# Patient Record
Sex: Male | Born: 1975 | Race: White | Hispanic: No | Marital: Single | Smoking: Heavy tobacco smoker
Health system: Southern US, Community
[De-identification: ages and names within clinical notes are randomized; demographics above are authoritative.]

## PROBLEM LIST (undated history)

## (undated) DIAGNOSIS — F419 Anxiety disorder, unspecified: Secondary | ICD-10-CM

## (undated) DIAGNOSIS — B169 Acute hepatitis B without delta-agent and without hepatic coma: Secondary | ICD-10-CM

## (undated) HISTORY — DX: Anxiety disorder, unspecified: F41.9

## (undated) HISTORY — DX: Acute hepatitis B without delta-agent and without hepatic coma: B16.9

## (undated) HISTORY — PX: ROTATOR CUFF REPAIR: SHX139

---

## 2019-06-15 ENCOUNTER — Inpatient Hospital Stay (HOSPITAL_COMMUNITY)
Admission: EM | Admit: 2019-06-15 | Discharge: 2019-06-19 | DRG: 443 | Disposition: A | Discharge status: Home / Self Care | Payer: Commercial Managed Care - PPO | Attending: Internal Medicine | Admitting: Internal Medicine

## 2019-06-15 ENCOUNTER — Emergency Department (HOSPITAL_COMMUNITY): Payer: Commercial Managed Care - PPO

## 2019-06-15 ENCOUNTER — Encounter (HOSPITAL_COMMUNITY): Payer: Self-pay | Admitting: Emergency Medicine

## 2019-06-15 ENCOUNTER — Other Ambulatory Visit: Payer: Self-pay

## 2019-06-15 DIAGNOSIS — F17210 Nicotine dependence, cigarettes, uncomplicated: Secondary | ICD-10-CM | POA: Diagnosis present

## 2019-06-15 DIAGNOSIS — R17 Unspecified jaundice: Secondary | ICD-10-CM | POA: Diagnosis present

## 2019-06-15 DIAGNOSIS — Z20828 Contact with and (suspected) exposure to other viral communicable diseases: Secondary | ICD-10-CM | POA: Diagnosis present

## 2019-06-15 DIAGNOSIS — B169 Acute hepatitis B without delta-agent and without hepatic coma: Secondary | ICD-10-CM | POA: Diagnosis not present

## 2019-06-15 DIAGNOSIS — Z79890 Hormone replacement therapy (postmenopausal): Secondary | ICD-10-CM

## 2019-06-15 DIAGNOSIS — Z79899 Other long term (current) drug therapy: Secondary | ICD-10-CM

## 2019-06-15 DIAGNOSIS — K5900 Constipation, unspecified: Secondary | ICD-10-CM | POA: Diagnosis present

## 2019-06-15 DIAGNOSIS — R7401 Elevation of levels of liver transaminase levels: Secondary | ICD-10-CM | POA: Diagnosis present

## 2019-06-15 LAB — COMPREHENSIVE METABOLIC PANEL
ALT: 2257 U/L — ABNORMAL HIGH (ref 0–44)
AST: 1289 U/L — ABNORMAL HIGH (ref 15–41)
Albumin: 3.7 g/dL (ref 3.5–5.0)
Alkaline Phosphatase: 186 U/L — ABNORMAL HIGH (ref 38–126)
Anion gap: 11 (ref 5–15)
BUN: 11 mg/dL (ref 6–20)
CO2: 25 mmol/L (ref 22–32)
Calcium: 9.1 mg/dL (ref 8.9–10.3)
Chloride: 99 mmol/L (ref 98–111)
Creatinine, Ser: 0.7 mg/dL (ref 0.61–1.24)
GFR calc Af Amer: >60 mL/min (ref >60)
GFR calc non Af Amer: >60 mL/min (ref >60)
Glucose, Bld: 116 mg/dL — ABNORMAL HIGH (ref 70–99)
Potassium: 3.5 mmol/L (ref 3.5–5.1)
Sodium: 135 mmol/L (ref 135–145)
Total Bilirubin: 10.9 mg/dL — ABNORMAL HIGH (ref 0.3–1.2)
Total Protein: 6.8 g/dL (ref 6.5–8.1)

## 2019-06-15 LAB — URINALYSIS, ROUTINE W REFLEX MICROSCOPIC
Bilirubin Urine: NEGATIVE
Glucose, UA: NEGATIVE mg/dL
Hgb urine dipstick: NEGATIVE
Ketones, ur: NEGATIVE mg/dL
Leukocytes,Ua: NEGATIVE
Nitrite: NEGATIVE
Protein, ur: NEGATIVE mg/dL
Specific Gravity, Urine: 1.008 (ref 1.005–1.030)
pH: 6 (ref 5.0–8.0)

## 2019-06-15 LAB — CBC
HCT: 39.8 % (ref 39.0–52.0)
Hemoglobin: 14.4 g/dL (ref 13.0–17.0)
MCH: 31.8 pg (ref 26.0–34.0)
MCHC: 36.2 g/dL — ABNORMAL HIGH (ref 30.0–36.0)
MCV: 87.9 fL (ref 80.0–100.0)
Platelets: 284 10*3/uL (ref 150–400)
RBC: 4.53 MIL/uL (ref 4.22–5.81)
RDW: 16.7 % — ABNORMAL HIGH (ref 11.5–15.5)
WBC: 8.1 10*3/uL (ref 4.0–10.5)
nRBC: 0 % (ref 0.0–0.2)

## 2019-06-15 LAB — PROTIME-INR
INR: 1 (ref 0.8–1.2)
Prothrombin Time: 13.1 seconds (ref 11.4–15.2)

## 2019-06-15 LAB — LIPASE, BLOOD: Lipase: 41 U/L (ref 11–51)

## 2019-06-15 LAB — ETHANOL: Alcohol, Ethyl (B): <10 mg/dL (ref <10)

## 2019-06-15 LAB — ACETAMINOPHEN LEVEL: Acetaminophen (Tylenol), Serum: <10 ug/mL — ABNORMAL LOW (ref 10–30)

## 2019-06-15 MED ORDER — IOHEXOL 300 MG/ML  SOLN
100.0000 mL | Freq: Once | INTRAMUSCULAR | Status: AC | PRN
  Administered 2019-06-15: 100 mL via INTRAVENOUS

## 2019-06-15 MED ORDER — SODIUM CHLORIDE (PF) 0.9 % IJ SOLN
INTRAMUSCULAR | Status: AC
  Filled 2019-06-15: qty 50

## 2019-06-15 NOTE — ED Triage Notes (Signed)
Pt reports having increasing hematuria and also family noticed yellowing of pt eyes. Pt family concerned for possible liver or kidney issues. Pt reports lower left back pain.

## 2019-06-15 NOTE — ED Provider Notes (Signed)
[facility] DEPT Provider Note   CSN: [id number] Arrival date & time: 06/15/19  1952     History   Chief Complaint Chief Complaint  Patient presents with  . Jaundice  . Hematuria    HPI Antonio Chapman is a 43 y.o. male.     HPI Patient presents with yellowing skin.  Has had for around the last week and a half.  States he first noticed that his skin was changing little yellow than other people began to notice 2.  Has some mild left low back pain.  No weight loss.  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  States he will take 2 to 3 g of Tylenol a day has been taking it somewhat over the last week also.  No fevers.  Does not drink alcohol.  Has tattoos but states he all came from reputable areas.  Urine is been a little bit darker.  Patient does not have a PCP. History reviewed. No pertinent past medical history.  There are no active problems to display for this patient.   Past Surgical History:  Procedure Laterality Date  . ROTATOR CUFF REPAIR Left         Home Medications    Prior to Admission medications   Not on File    Family History History reviewed. No pertinent family history.  Social History Social History   Tobacco Use  . Smoking status: Heavy Tobacco Smoker    Packs/day: 1.00    Types: Cigarettes  . Smokeless tobacco: Never Used  Substance Use Topics  . Alcohol use: Never    Frequency: Never  . Drug use: Yes    Types: Marijuana     Allergies   Patient has no known allergies.   Review of Systems Review of Systems  Constitutional: Negative for appetite change, diaphoresis, fatigue and fever.  HENT: Negative for congestion.   Respiratory: Negative for shortness of breath.   Cardiovascular: Negative for chest pain.  Gastrointestinal: Positive for constipation. Negative for abdominal pain and blood in stool.  Genitourinary: Negative for difficulty urinating.  Musculoskeletal:  Positive for arthralgias.  Neurological: Negative for weakness.  Psychiatric/Behavioral: Negative for confusion.     Physical Exam Updated Vital Signs BP 138/87 (BP Location: Left Arm)   Pulse 92   Temp 98.5 F (36.9 C) (Oral)   Resp 17   Ht 5' 7"  (1.702 m)   Wt 74.8 kg   SpO2 98%   BMI 25.84 kg/m   Physical Exam Vitals signs and nursing note reviewed.  HENT:     Head: Normocephalic.     Mouth/Throat:     Mouth: Mucous membranes are moist.  Eyes:     General: Scleral icterus present.  Cardiovascular:     Rate and Rhythm: Normal rate.  Pulmonary:     Effort: Pulmonary effort is normal.     Breath sounds: No wheezing or rhonchi.  Abdominal:     Tenderness: There is no abdominal tenderness.  Musculoskeletal:     Right lower leg: No edema.     Left lower leg: No edema.  Skin:    Capillary Refill: Capillary refill takes less than 2 seconds.     Coloration: Skin is jaundiced.  Neurological:     Mental Status: He is alert and oriented to person, place, and time.      ED Treatments / Results  Labs (all labs ordered are listed, but only abnormal results are  displayed) Labs Reviewed  COMPREHENSIVE METABOLIC PANEL - Abnormal; Notable for the following components:      Result Value   Glucose, Bld 116 (*)    AST 1,289 (*)    ALT 2,257 (*)    Alkaline Phosphatase 186 (*)    Total Bilirubin 10.9 (*)    All other components within normal limits  CBC - Abnormal; Notable for the following components:   MCHC 36.2 (*)    RDW 16.7 (*)    All other components within normal limits  URINALYSIS, ROUTINE W REFLEX MICROSCOPIC - Abnormal; Notable for the following components:   Color, Urine AMBER (*)    All other components within normal limits  LIPASE, BLOOD  ACETAMINOPHEN LEVEL  PROTIME-INR  ETHANOL  HEPATITIS PANEL, ACUTE    EKG None  Radiology No results found.  Procedures Procedures (including critical care time)  Medications Ordered in ED Medications - No  data to display   Initial Impression / Assessment and Plan / ED Course  I have reviewed the triage vital signs and the nursing notes.  Pertinent labs & imaging results that were available during my care of the patient were reviewed by me and considered in my medical decision making (see chart for details).        Patient presents with painless jaundice.  Does not drink alcohol.  Has been taking Tylenol but at what sounds like therapeutic doses.  AST over 1000 and ALT over 2000.  Alk phos only mildly elevated with bilirubin of 10.  Normal lipase.  Good renal function.  Normal albumin.  Does not have outpatient providers.  With the enzymes is elevated CR feels the patient benefit from [facility] the hospital.  Will get hepatitis panel add Tylenol level and alcohol level.  Will get ultrasound to start imaging.  Final Clinical Impressions(s) / ED Diagnoses   Final diagnoses:  Jaundice  Transaminitis    ED Discharge Orders    None       Davonna Belling, MD 06/15/19 2151

## 2019-06-16 ENCOUNTER — Inpatient Hospital Stay (HOSPITAL_COMMUNITY): Payer: Commercial Managed Care - PPO

## 2019-06-16 DIAGNOSIS — S36119A Unspecified injury of liver, initial encounter: Secondary | ICD-10-CM

## 2019-06-16 DIAGNOSIS — R740 Nonspecific elevation of levels of transaminase and lactic acid dehydrogenase [LDH]: Secondary | ICD-10-CM

## 2019-06-16 DIAGNOSIS — R109 Unspecified abdominal pain: Secondary | ICD-10-CM | POA: Diagnosis not present

## 2019-06-16 DIAGNOSIS — Z20828 Contact with and (suspected) exposure to other viral communicable diseases: Secondary | ICD-10-CM | POA: Diagnosis present

## 2019-06-16 DIAGNOSIS — R1013 Epigastric pain: Secondary | ICD-10-CM

## 2019-06-16 DIAGNOSIS — Z79899 Other long term (current) drug therapy: Secondary | ICD-10-CM | POA: Diagnosis not present

## 2019-06-16 DIAGNOSIS — F17210 Nicotine dependence, cigarettes, uncomplicated: Secondary | ICD-10-CM | POA: Diagnosis present

## 2019-06-16 DIAGNOSIS — B169 Acute hepatitis B without delta-agent and without hepatic coma: Secondary | ICD-10-CM | POA: Diagnosis present

## 2019-06-16 DIAGNOSIS — R12 Heartburn: Secondary | ICD-10-CM | POA: Diagnosis not present

## 2019-06-16 DIAGNOSIS — K5900 Constipation, unspecified: Secondary | ICD-10-CM | POA: Diagnosis present

## 2019-06-16 DIAGNOSIS — S36119D Unspecified injury of liver, subsequent encounter: Secondary | ICD-10-CM | POA: Diagnosis not present

## 2019-06-16 DIAGNOSIS — Z79890 Hormone replacement therapy (postmenopausal): Secondary | ICD-10-CM | POA: Diagnosis not present

## 2019-06-16 DIAGNOSIS — R17 Unspecified jaundice: Secondary | ICD-10-CM | POA: Diagnosis present

## 2019-06-16 LAB — PROTIME-INR
INR: 1.1 (ref 0.8–1.2)
Prothrombin Time: 13.6 seconds (ref 11.4–15.2)

## 2019-06-16 LAB — COMPREHENSIVE METABOLIC PANEL
ALT: 2107 U/L — ABNORMAL HIGH (ref 0–44)
AST: 1280 U/L — ABNORMAL HIGH (ref 15–41)
Albumin: 3.6 g/dL (ref 3.5–5.0)
Alkaline Phosphatase: 163 U/L — ABNORMAL HIGH (ref 38–126)
Anion gap: 7 (ref 5–15)
BUN: 10 mg/dL (ref 6–20)
CO2: 26 mmol/L (ref 22–32)
Calcium: 9.2 mg/dL (ref 8.9–10.3)
Chloride: 103 mmol/L (ref 98–111)
Creatinine, Ser: 0.68 mg/dL (ref 0.61–1.24)
GFR calc Af Amer: >60 mL/min (ref >60)
GFR calc non Af Amer: >60 mL/min (ref >60)
Glucose, Bld: 94 mg/dL (ref 70–99)
Potassium: 4.2 mmol/L (ref 3.5–5.1)
Sodium: 136 mmol/L (ref 135–145)
Total Bilirubin: 12.7 mg/dL — ABNORMAL HIGH (ref 0.3–1.2)
Total Protein: 7 g/dL (ref 6.5–8.1)

## 2019-06-16 LAB — IRON AND TIBC
Iron: 283 ug/dL — ABNORMAL HIGH (ref 45–182)
Saturation Ratios: 81 % — ABNORMAL HIGH (ref 17.9–39.5)
TIBC: 350 ug/dL (ref 250–450)
UIBC: 67 ug/dL

## 2019-06-16 LAB — CK: Total CK: 61 U/L (ref 49–397)

## 2019-06-16 LAB — SARS CORONAVIRUS 2 BY RT PCR (HOSPITAL ORDER, PERFORMED IN ~~LOC~~ HOSPITAL LAB): SARS Coronavirus 2: NEGATIVE

## 2019-06-16 LAB — FERRITIN: Ferritin: 4521 ng/mL — ABNORMAL HIGH (ref 24–336)

## 2019-06-16 LAB — TSH: TSH: 0.768 u[IU]/mL (ref 0.350–4.500)

## 2019-06-16 LAB — HIV ANTIBODY (ROUTINE TESTING W REFLEX): HIV Screen 4th Generation wRfx: NONREACTIVE

## 2019-06-16 LAB — GAMMA GT: GGT: 212 U/L — ABNORMAL HIGH (ref 7–50)

## 2019-06-16 MED ORDER — SODIUM CHLORIDE 0.9 % IV SOLN: 250.0000 mL | INTRAVENOUS | Status: DC | PRN

## 2019-06-16 MED ORDER — MELATONIN 5 MG PO TABS
10.0000 mg | ORAL_TABLET | Freq: Every evening | ORAL | Status: DC | PRN
  Filled 2019-06-16: qty 2

## 2019-06-16 MED ORDER — ENOXAPARIN SODIUM 40 MG/0.4ML ~~LOC~~ SOLN
40.0000 mg | SUBCUTANEOUS | Status: DC
  Administered 2019-06-16 – 2019-06-19 (×4): 40 mg via SUBCUTANEOUS
  Filled 2019-06-16 (×4): qty 0.4

## 2019-06-16 MED ORDER — TECHNETIUM TC 99M MEBROFENIN IV KIT
10.7000 | PACK | Freq: Once | INTRAVENOUS | Status: AC | PRN
  Administered 2019-06-16: 10.7 via INTRAVENOUS

## 2019-06-16 MED ORDER — ACETAMINOPHEN 500 MG PO TABS: 1000.0000 mg | ORAL_TABLET | Freq: Four times a day (QID) | ORAL | Status: DC | PRN

## 2019-06-16 MED ORDER — SODIUM CHLORIDE 0.9% FLUSH: 3.0000 mL | INTRAVENOUS | Status: DC | PRN

## 2019-06-16 MED ORDER — PANTOPRAZOLE SODIUM 40 MG PO TBEC
40.0000 mg | DELAYED_RELEASE_TABLET | Freq: Every day | ORAL | Status: DC
  Administered 2019-06-17 – 2019-06-19 (×3): 40 mg via ORAL
  Filled 2019-06-16 (×3): qty 1

## 2019-06-16 MED ORDER — SODIUM CHLORIDE 0.45 % IV SOLN
INTRAVENOUS | Status: DC
  Administered 2019-06-16 – 2019-06-18 (×4): via INTRAVENOUS

## 2019-06-16 NOTE — Consult Note (Deleted)
[facility] Medical Consultation  Gust Broomshomas Simkin NWG:[id number] DOB: 02/18/1976 DOA: 06/15/2019 PCP: Etta GrandchildJones, Glendel L, MD   Requesting physician: Dr. Rubin PayorPickering Date of consultation: 06/15/2019 Reason for consultation: Painless jaundice  Impression/Recommendations Active Problems:   Painless jaundice    1. Painless Jaundice - patent with painless jaundice. U/S abd with thickened GB wall but compressed. No visible stone. CT abd negative except for Gb findings as above. Lab remarkable for elevated LFTs. Hepatitis panel pending. Patient feels well.  No indication for admission given the patient's stability. Suspect Hep A although Hep C cannot be ruled out. He is a good candidate for outpatient follow up, including possible further diagnostic testing if Hepatitis screen is negative.  Patient is instructed to contact his PCP follow-up and for referral to GI for further evaluation. He should not return to work until cleared by his PCP or GI physician.   Chief Complaint: Painless jaundice  HPI:  Mr. Novella RobYackle is a healthy 43 year old gentleman who presents for painless jaundice.  He noticed this incidentally.  He has had no symptoms.  Denies fever, sweats, chills, denies nausea or vomiting, he denies weight loss.  He does not use IV drugs.  Has no exposure risks.  He has not Been around any sick persons. He has no prior hx of liver disease of any kind.  Review of Systems:  Cardio - negative Pul - negative: no cough, SOB GI - no pain, no N/V/D GU- negative MSK - negative  History reviewed. No pertinent past medical history. Past Surgical History:  Procedure Laterality Date  . ROTATOR CUFF REPAIR Left    Social History: [employer] veteran.  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 Has a stable relationship.   reports that he has been smoking cigarettes. He has been smoking about 1.00 pack per day. He has never used smokeless tobacco. He reports current drug use. Drug: Marijuana. He reports that he does not  drink alcohol.  No Known Allergies History reviewed. No pertinent family history.  Prior to Admission medications   Medication Sig Start Date End Date Taking? Authorizing Provider  acetaminophen (TYLENOL) 500 MG tablet Take 1,000 mg by mouth every 6 (six) hours as needed for mild pain, moderate pain or headache.   Yes [provider]  Melatonin 10 MG CAPS Take 10 mg by mouth at bedtime as needed (sleep).   Yes [provider]  naproxen sodium (ALEVE) 220 MG tablet Take 440 mg by mouth 2 (two) times daily as needed (pain).   Yes [provider]  omeprazole (PRILOSEC) 20 MG capsule Take 20 mg by mouth daily.   Yes [provider]   Physical Exam: Blood pressure 110/74, pulse 86, temperature 98.5 F (36.9 C), temperature source Oral, resp. rate 18, height 5\' 7"  (1.702 m), weight 74.8 kg, SpO2 98 %. Vitals:   06/15/19 2230 06/15/19 2245  BP: 110/74   Pulse: 85 86  Resp: (!) 24 18  Temp:    SpO2: 98% 98%     General:  WNWD man in no distress  Eyes: Mild icterus, PERRLA  ENT: Oropharynx nl  Neck: supple, no thyromegally  Cardiovascular: RRR  Respiratory: normal respirations  Abdomen: no tenderness, Guarding, rebound, nl BS  Skin: mild jaundice  Musculoskeletal: normal  Psychiatric: normal mood  Neurologic: non-focal  Labs on Admission:  Basic Metabolic Panel: Recent Labs  Lab 06/15/19 2040  NA 135  K 3.5  CL 99  CO2 25  GLUCOSE 116*  BUN 11  CREATININE 0.70  CALCIUM 9.1   Liver Function Tests: Recent Labs  Lab 06/15/19 2040  AST 1,289*  ALT 2,257*  ALKPHOS 186*  BILITOT 10.9*  PROT 6.8  ALBUMIN 3.7   Recent Labs  Lab 06/15/19 2040  LIPASE 41   No results for input(s): AMMONIA in the last 168 hours. CBC: Recent Labs  Lab 06/15/19 2040  WBC 8.1  HGB 14.4  HCT 39.8  MCV 87.9  PLT 284   Cardiac Enzymes: No results for input(s): CKTOTAL, CKMB, CKMBINDEX, TROPONINI in the last 168 hours. BNP: Invalid  input(s): POCBNP CBG: No results for input(s): GLUCAP in the last 168 hours.  Radiological Exams on Admission: US Abdomen Complete  Result Date: 06/15/2019 CLINICAL DATA:  Jaundice EXAM: ABDOMEN ULTRASOUND COMPLETE COMPARISON:  None. FINDINGS: Gallbladder: The gallbladder is largely decompressed at the time of sonographic imaging which results in suboptimal evaluation. There is borderline gallbladder wall thickening measuring up to 5 mm in maximal thickness. There is a trace amount of pericholecystic fluid in the gallbladder fossa as well. Sonographic Murphy sign however is reportedly negative. No visible calcified gallstones are evident. Common bile duct: Diameter: 3.5 mm, normal Liver: No focal lesion identified. Within normal limits in parenchymal echogenicity. Portal vein is patent on color Doppler imaging with normal direction of blood flow towards the liver. IVC: No abnormality visualized. Pancreas: Visualized portion unremarkable. Spleen: Size and appearance within normal limits. Right Kidney: Length: 11.9 cm. Echogenicity within normal limits. No mass or hydronephrosis visualized. Left Kidney: Length: 11.4 cm. Echogenicity within normal limits. No mass or hydronephrosis visualized. Abdominal aorta: No aneurysm visualized. Normal caliber at 2.4 cm in the mid segment. Other findings: None. IMPRESSION: Gallbladder is largely decompressed at the time of exam with mild wall thickening which may be related to this underdistention. A trace amount of pericholecystic fluid is nonspecific particularly given the negative sonographic Murphy sign. If there is persisting clinical concern for cholecystitis, could repeat limited right upper quadrant ultrasound in the fasting state or pursue a HIDA scan when available. Otherwise unremarkable abdominal ultrasound. Electronically Signed   By: Kreg Shropshire M.D.   On: 06/15/2019 22:22   Ct Abdomen Pelvis W Contrast  Result Date: 06/16/2019 CLINICAL DATA:  Painless  jaundice, fatigue and weight loss EXAM: CT ABDOMEN AND PELVIS WITH CONTRAST TECHNIQUE: Multidetector CT imaging of the abdomen and pelvis was performed using the standard protocol following bolus administration of intravenous contrast. CONTRAST:  OMNIPAQUE IOHEXOL 300 MG/ML  SOLN COMPARISON:  Abdominal ultrasound June 15, 2019 FINDINGS: Lower chest: Lung bases are clear. Normal heart size. No pericardial effusion. Hepatobiliary: No suspicious hepatic lesions. The gallbladder is largely decompressed at the time exam however gallbladder wall thickening measures up to 6 mm in maximal thickness. There is adjacent pericholecystic inflammation and fluid. Small amount of fluid tracks along the inferior liver as well. Pancreas: Unremarkable. No pancreatic ductal dilatation or surrounding inflammatory changes. Spleen: Normal in size without focal abnormality. Adrenals/Urinary Tract: Adrenal glands are unremarkable. Kidneys are normal, without renal calculi, focal lesion, or hydronephrosis. Mild bladder wall thickening with faint perivesicular haze. Stomach/Bowel: Distal esophagus, stomach and duodenal sweep are unremarkable. No bowel wall thickening or dilatation. No evidence of obstruction. A normal appendix is visualized. No colonic dilatation or wall thickening. Vascular/Lymphatic: Atherosclerotic plaque within the normal caliber aorta. No aneurysm or ectasia. No suspicious or enlarged lymph nodes in the included lymphatic chains. Reproductive: The prostate and seminal vesicles are unremarkable. Other: Small amount of pericholecystic and right upper quadrant fluid.  No free intraperitoneal air. No bowel containing hernias. Musculoskeletal: No acute osseous abnormality or suspicious osseous lesion. Mild discogenic and facet degenerative changes are present in the included portions of the thoracic lumbar spine. IMPRESSION: 1. The gallbladder is largely decompressed at the time exam however gallbladder wall  thickening measures up to 6 mm in maximal thickness. There is adjacent pericholecystic and right upper quadrant fluid. Findings are concerning for acute cholecystitis in the appropriate clinical setting 2. Mild bladder wall thickening with faint perivesicular haze, suggestive of cystitis. Correlate with urinary symptoms and consider urinalysis. 3. Aortic Atherosclerosis (ICD10-I70.0). Electronically Signed   By: Lovena Le M.D.   On: 06/16/2019 00:07    EKG: Independently reviewed. none  Time spent: 30 min  Adella Hare [facility] Pager [phone]  If 7PM-7AM, please contact night-coverage [email or web address] Password [email or web address] 06/16/2019, 1:20 AM

## 2019-06-16 NOTE — Progress Notes (Signed)
PROGRESS NOTE    Antonio Chapman  CVE:[id number] DOB: [birth year] DOA: 06/15/2019 PCP: Janith Lima, MD    Brief Narrative: 43 y.o. male with medical history significant of rotator cuff tear but otherwise healthy. He noticed that he looked a little yellow and came to [facility] ED for evaluation.He has been totally asymptomatic: no fever, sweats, chills, weight loss, N/V. He denies IVDU, denies EtOH use.    ED Course: Hemodynamically stable. Labs revealed marked elevation LFTs ALT>AST, Abd U/S with thickened GB wall but no stones, CT abd - negative except for thick wall GB, pericholic fluid, Hep panel pending. ED-MD called Dr. Ardis Hughs on call for [facility] who recommended admission to trend LFTs and to establish GI care.  Assessment & Plan:   Active Problems:   Painless jaundice  #1 painless jaundice-with elevated bilirubin 10.9 and transaminitis.  HIDA scan pending.  Seen by GI.  Follow-up hepatitis panel.  CT of the abdomen shows probable gallbladder wall thickening and some pericholecystic fluid with normal CBD and normal-appearing liver.  DVT prophylaxis with Lovenox CODE STATUS full code Family communication none Disposition pending clinical improvement Consults GI   Estimated body mass index is 26.76 kg/m as calculated from the following:   Height as of this encounter: 5\' 7"  (1.702 m).   Weight as of this encounter: 77.5 kg.    Subjective: No complaints no nausea vomiting abdominal pain  Objective: Vitals:   06/16/19 0306 06/16/19 0351 06/16/19 0651 06/16/19 1316  BP: 117/76 118/86 110/73 116/85  Pulse: 84 83 74 79  Resp: 18 20 16 16   Temp:  97.8 F (36.6 C) 97.9 F (36.6 C) 98.7 F (37.1 C)  TempSrc:  Oral Oral Oral  SpO2: 98% 98% 97% 98%  Weight:  77.5 kg    Height:  5\' 7"  (1.702 m)      Intake/Output Summary (Last 24 hours) at 06/16/2019 1412 Last data filed at 06/16/2019 0830 Gross per 24 hour  Intake 240 ml  Output -  Net 240 ml   Filed Weights   06/15/19  2028 06/16/19 0351  Weight: 74.8 kg 77.5 kg    Examination:  General exam: Appears calm and comfortable  Respiratory system: Clear to auscultation. Respiratory effort normal. Cardiovascular system: S1 & S2 heard, RRR. No JVD, murmurs, rubs, gallops or clicks. No pedal edema. Gastrointestinal system: Abdomen is nondistended, soft and nontender. No organomegaly or masses felt. Normal bowel sounds heard. Central nervous system: Alert and oriented. No focal neurological deficits. Extremities: Symmetric 5 x 5 power. Skin: No rashes, lesions or ulcers Psychiatry: Judgement and insight appear normal. Mood & affect appropriate.     Data Reviewed: I have personally reviewed following labs and imaging studies  CBC: Recent Labs  Lab 06/15/19 2040  WBC 8.1  HGB 14.4  HCT 39.8  MCV 87.9  PLT 025   Basic Metabolic Panel: Recent Labs  Lab 06/15/19 2040 06/16/19 0647  NA 135 136  K 3.5 4.2  CL 99 103  CO2 25 26  GLUCOSE 116* 94  BUN 11 10  CREATININE 0.70 0.68  CALCIUM 9.1 9.2   GFR: Estimated Creatinine Clearance: 110.2 mL/min (by C-G formula based on SCr of 0.68 mg/dL). Liver Function Tests: Recent Labs  Lab 06/15/19 2040 06/16/19 0647  AST 1,289* 1,280*  ALT 2,257* 2,107*  ALKPHOS 186* 163*  BILITOT 10.9* 12.7*  PROT 6.8 7.0  ALBUMIN 3.7 3.6   Recent Labs  Lab 06/15/19 2040  LIPASE 41   No results  for input(s): AMMONIA in the last 168 hours. Coagulation Profile: Recent Labs  Lab 06/15/19 2142  INR 1.0   Cardiac Enzymes: Recent Labs  Lab 06/16/19 0814  CKTOTAL 61   BNP (last 3 results) No results for input(s): PROBNP in the last 8760 hours. HbA1C: No results for input(s): HGBA1C in the last 72 hours. CBG: No results for input(s): GLUCAP in the last 168 hours. Lipid Profile: No results for input(s): CHOL, HDL, LDLCALC, TRIG, CHOLHDL, LDLDIRECT in the last 72 hours. Thyroid Function Tests: Recent Labs    06/16/19 1307  TSH 0.768   Anemia Panel:  Recent Labs    06/16/19 1110  FERRITIN 4,521*  TIBC 350  IRON 283*   Sepsis Labs: No results for input(s): PROCALCITON, LATICACIDVEN in the last 168 hours.  Recent Results (from the past 240 hour(s))  SARS Coronavirus 2 [facility](Hospital order, Performed [facility]) Nasopharyngeal Nasopharyngeal Swab     Status: None   Collection Time: 06/15/19 11:14 PM   Specimen: Nasopharyngeal Swab  Result Value Ref Range Status   SARS Coronavirus 2 NEGATIVE NEGATIVE Final    Comment: (NOTE) If result is NEGATIVE SARS-CoV-2 target nucleic acids are NOT DETECTED. The SARS-CoV-2 RNA is generally detectable in upper and lower  respiratory specimens during the acute phase of infection. The lowest  concentration of SARS-CoV-2 viral copies this assay can detect is 250  copies / mL. A negative result does not preclude SARS-CoV-2 infection  and should not be used as the sole basis for treatment or other  patient management decisions.  A negative result may occur with  improper specimen collection / handling, submission of specimen other  than nasopharyngeal swab, presence of viral mutation(s) within the  areas targeted by this assay, and inadequate number of viral copies  (<250 copies / mL). A negative result must be combined with clinical  observations, patient history, and epidemiological information. If result is POSITIVE SARS-CoV-2 target nucleic acids are DETECTED. The SARS-CoV-2 RNA is generally detectable in upper and lower  respiratory specimens dur ing the acute phase of infection.  Positive  results are indicative of active infection with SARS-CoV-2.  Clinical  correlation with patient history and other diagnostic information is  necessary to determine patient infection status.  Positive results do  not rule out bacterial infection or co-infection with other viruses. If result is PRESUMPTIVE POSTIVE SARS-CoV-2 nucleic acids MAY BE PRESENT.   A presumptive positive result was  obtained on the submitted specimen  and confirmed on repeat testing.  While 2019 novel coronavirus  (SARS-CoV-2) nucleic acids may be present in the submitted sample  additional confirmatory testing may be necessary for epidemiological  and / or clinical management purposes  to differentiate between  SARS-CoV-2 and other Sarbecovirus currently known to infect humans.  If clinically indicated additional testing with an alternate test  methodology [phone] is advised. The SARS-CoV-2 RNA is generally  detectable in upper and lower respiratory sp ecimens during the acute  phase of infection. The expected result is Negative. Fact Sheet for Patients:  BoilerBrush.com.cyhttps://www.fda.gov/media/136312/download Fact Sheet for Healthcare Providers: https://pope.com/https://www.fda.gov/media/136313/download This test is not yet approved or cleared by the [place] FDA and has been authorized for detection and/or diagnosis of SARS-CoV-2 by FDA under an Emergency Use Authorization (EUA).  This EUA will remain in effect (meaning this test can be used) for the duration of the COVID-19 declaration under Section 564(b)(1) of the Act, 21 U.S.C. section 360bbb-3(b)(1), unless the authorization is terminated or  revoked sooner. Performed at [facility], [address] [place], [place] [address]          Radiology Studies: Kore[place] Abdomen Complete  Result Date: 06/15/2019 CLINICAL DATA:  Jaundice EXAM: ABDOMEN ULTRASOUND COMPLETE COMPARISON:  None. FINDINGS: Gallbladder: The gallbladder is largely decompressed at the time of sonographic imaging which results in suboptimal evaluation. There is borderline gallbladder wall thickening measuring up to 5 mm in maximal thickness. There is a trace amount of pericholecystic fluid in the gallbladder fossa as well. Sonographic Murphy sign however is reportedly negative. No visible calcified gallstones are evident. Common bile duct: Diameter: 3.5 mm, normal Liver: No focal  lesion identified. Within normal limits in parenchymal echogenicity. Portal vein is patent on color Doppler imaging with normal direction of blood flow towards the liver. IVC: No abnormality visualized. Pancreas: Visualized portion unremarkable. Spleen: Size and appearance within normal limits. Right Kidney: Length: 11.9 cm. Echogenicity within normal limits. No mass or hydronephrosis visualized. Left Kidney: Length: 11.4 cm. Echogenicity within normal limits. No mass or hydronephrosis visualized. Abdominal aorta: No aneurysm visualized. Normal caliber at 2.4 cm in the mid segment. Other findings: None. IMPRESSION: Gallbladder is largely decompressed at the time of exam with mild wall thickening which may be related to this underdistention. A trace amount of pericholecystic fluid is nonspecific particularly given the negative sonographic Murphy sign. If there is persisting clinical concern for cholecystitis, could repeat limited right upper quadrant ultrasound in the fasting state or pursue a HIDA scan when available. Otherwise unremarkable abdominal ultrasound. Electronically Signed   By: Kreg ShropshirePrice  DeHay M.D.   On: 06/15/2019 22:22   Ct Abdomen Pelvis W Contrast  Result Date: 06/16/2019 CLINICAL DATA:  Painless jaundice, fatigue and weight loss EXAM: CT ABDOMEN AND PELVIS WITH CONTRAST TECHNIQUE: Multidetector CT imaging of the abdomen and pelvis was performed using the standard protocol following bolus administration of intravenous contrast. CONTRAST:  100mL OMNIPAQUE IOHEXOL 300 MG/ML  SOLN COMPARISON:  Abdominal ultrasound June 15, 2019 FINDINGS: Lower chest: Lung bases are clear. Normal heart size. No pericardial effusion. Hepatobiliary: No suspicious hepatic lesions. The gallbladder is largely decompressed at the time exam however gallbladder wall thickening measures up to 6 mm in maximal thickness. There is adjacent pericholecystic inflammation and fluid. Small amount of fluid tracks along the inferior  liver as well. Pancreas: Unremarkable. No pancreatic ductal dilatation or surrounding inflammatory changes. Spleen: Normal in size without focal abnormality. Adrenals/Urinary Tract: Adrenal glands are unremarkable. Kidneys are normal, without renal calculi, focal lesion, or hydronephrosis. Mild bladder wall thickening with faint perivesicular haze. Stomach/Bowel: Distal esophagus, stomach and duodenal sweep are unremarkable. No bowel wall thickening or dilatation. No evidence of obstruction. A normal appendix is visualized. No colonic dilatation or wall thickening. Vascular/Lymphatic: Atherosclerotic plaque within the normal caliber aorta. No aneurysm or ectasia. No suspicious or enlarged lymph nodes in the included lymphatic chains. Reproductive: The prostate and seminal vesicles are unremarkable. Other: Small amount of pericholecystic and right upper quadrant fluid. No free intraperitoneal air. No bowel containing hernias. Musculoskeletal: No acute osseous abnormality or suspicious osseous lesion. Mild discogenic and facet degenerative changes are present in the included portions of the thoracic lumbar spine. IMPRESSION: 1. The gallbladder is largely decompressed at the time exam however gallbladder wall thickening measures up to 6 mm in maximal thickness. There is adjacent pericholecystic and right upper quadrant fluid. Findings are concerning for acute cholecystitis in the appropriate clinical setting 2. Mild bladder wall thickening with faint perivesicular haze, suggestive  of cystitis. Correlate with urinary symptoms and consider urinalysis. 3. Aortic Atherosclerosis (ICD10-I70.0). Electronically Signed   By: Kreg Shropshire M.D.   On: 06/16/2019 00:07        Scheduled Meds: . enoxaparin (LOVENOX) injection  40 mg Subcutaneous Q24H  . pantoprazole  40 mg Oral Daily   Continuous Infusions: . sodium chloride 125 mL/hr at 06/16/19 1258  . sodium chloride       LOS: 0 days     Alwyn Ren,  MD [facility]  If 7PM-7AM, please contact night-coverage [email or web address] Password [email or web address] 06/16/2019, 2:12 PM

## 2019-06-16 NOTE — Consult Note (Signed)
Consultation  Referring Provider: [facility] Rodena Piety MD Primary Care Physician:  Janith Lima, MD Primary Gastroenterologist:  None/unassigned  Reason for Consultation: Jaundice  HPI: Antonio Chapman is a 43 y.o. male, generally in good health with no known chronic medical problems who was admitted through the emergency room yesterday after presenting with jaundice. Patient has no prior history of abnormal liver tests, hepatitis or liver problems that he is aware of. He says he developed what he thought was severe indigestion 11 or 12 days ago with epigastric discomfort which was fairly constant throughout the day.  This was associated with decrease in appetite but no nausea or vomiting.  No fever or chills.  He started taking Prilosec which he thought was helping.  Around that same time he noticed darkening of his urine but thought he may be dehydrated so started pushing water. He denies any recent fatigue.  Weight has been stable.  He does take Tylenol and Aleve but not in excess of doses.  He had not been taking any other over-the-counter medications, no other dietary supplements.  He smokes marijuana but denies any other current drug use.  He does have remote history of drug use last about 25 years ago. Patient has tattoos, the last about 2 years ago, he does admit to unprotected sex  in the past couple of months. Of note he has had a lot of of what he thinks are arthritic type symptoms primarily over the past year with knee and ankle pain and some hip pain.  Over the past couple of months has had onset of upper extremity joint discomfort primarily in his shoulders and wrists.  No history of EtOH use, family history negative for liver disease as far as he is aware, negative for autoimmune disease as far as he is aware.  Work-up in the emergency room with upper abdominal ultrasound showed a thickened gallbladder wall at 5 mm with trace pericholecystic fluid, CBD of 3.5 mm and normal-appearing  liver. CT of the abdomen and pelvis yesterday showed the gallbladder to be decompressed but gallbladder wall about 6 mm and there was adjacent pericholecystic fluid raising question of acute cholecystitis.  Normal-appearing liver.  Also had mild bladder wall thickening with faint perivesicular haze suggestive of cystitis.  Labs revealed T bili of 12.7/alk phos 163/AST 1280/ALT 2107. Creatinine 0.68 CBC within normal limits INR 1.0 Acute hepatitis panel pending, EtOH level negative, COVID-19 negative HIV pending   History reviewed. No pertinent past medical history.  Past Surgical History:  Procedure Laterality Date  . ROTATOR CUFF REPAIR Left     Prior to Admission medications   Medication Sig Start Date End Date Taking? Authorizing Provider  acetaminophen (TYLENOL) 500 MG tablet Take 1,000 mg by mouth every 6 (six) hours as needed for mild pain, moderate pain or headache.   Yes [provider]  Melatonin 10 MG CAPS Take 10 mg by mouth at bedtime as needed (sleep).   Yes [provider]  naproxen sodium (ALEVE) 220 MG tablet Take 440 mg by mouth 2 (two) times daily as needed (pain).   Yes [provider]  omeprazole (PRILOSEC) 20 MG capsule Take 20 mg by mouth daily.   Yes [provider]    Current Facility-Administered Medications  Medication Dose Route Frequency Provider Last Rate Last Dose  . 0.9 %  sodium chloride infusion  250 mL Intravenous PRN Norins, Heinz Knuckles, MD      . acetaminophen (TYLENOL) tablet 1,000 mg  1,000 mg Oral Q6H PRN Norins, Heinz Knuckles, MD      . enoxaparin (LOVENOX) injection 40 mg  40 mg Subcutaneous Q24H Norins, Heinz Knuckles, MD      . Melatonin TABS 10 mg  10 mg Oral QHS PRN Norins, Heinz Knuckles, MD      . pantoprazole (PROTONIX) EC tablet 40 mg  40 mg Oral Daily Norins, Michael E, MD      . sodium chloride flush (NS) 0.9 % injection 3 mL  3 mL Intravenous PRN Norins, Heinz Knuckles, MD        Allergies as of 06/15/2019  . (No  Known Allergies)    History reviewed. No pertinent family history.  Social History   Socioeconomic History  . Marital status: Single    Spouse name: Not on file  . Number of children: Not on file  . Years of education: Not on file  . Highest education level: Not on file  Occupational History  . Not on file  Social Needs  . Financial resource strain: Not on file  . Food insecurity    Worry: Not on file    Inability: Not on file  . Transportation needs    Medical: Not on file    Non-medical: Not on file  Tobacco Use  . Smoking status: Heavy Tobacco Smoker    Packs/day: 1.00    Types: Cigarettes  . Smokeless tobacco: Never Used  Substance and Sexual Activity  . Alcohol use: Never    Frequency: Never  . Drug use: Yes    Types: Marijuana  . Sexual activity: Not on file  Lifestyle  . Physical activity    Days per week: Not on file    Minutes per session: Not on file  . Stress: Not on file  Relationships  . Social Herbalist on phone: Not on file    Gets together: Not on file    Attends religious service: Not on file    Active member of club or organization: Not on file    Attends meetings of clubs or organizations: Not on file    Relationship status: Not on file  . Intimate partner violence    Fear of current or ex partner: Not on file    Emotionally abused: Not on file    Physically abused: Not on file    Forced sexual activity: Not on file  Other Topics Concern  . Not on file  Social History Narrative  . Not on file    Review of Systems: Pertinent positive and negative review of systems were noted in the above HPI section.  All other review of systems was otherwise negative.  Physical Exam: Vital signs in last 24 hours: Temp:  [97.8 F (36.6 C)-98.5 F (36.9 C)] 97.9 F (36.6 C) (09/14 0651) Pulse Rate:  [74-135] 74 (09/14 0651) Resp:  [11-25] 16 (09/14 0651) BP: (110-138)/(69-87) 110/73 (09/14 0651) SpO2:  [96 %-100 %] 97 % (09/14 0651)  Weight:  [74.8 kg-77.5 kg] 77.5 kg (09/14 0351) Last BM Date: 06/15/19 General:   Alert,  Well-developed, well-nourished white male, pleasant and cooperative in NAD jaundiced Head:  Normocephalic and atraumatic. Eyes:  Sclera icteric conjunctiva Antonio. Ears:  Normal auditory acuity. Nose:  No deformity, discharge,  or lesions. Mouth:  No deformity or lesions.   Neck:  Supple; no masses or thyromegaly. Lungs:  Clear throughout to auscultation.   No wheezes, crackles, or rhonchi. Heart:  Regular rate and rhythm; no murmurs,  clicks, rubs,  or gallops. Abdomen:  Soft, minimally tender epigastrium and right upper quadrant, no guarding, BS active,nonpalp mass or hsm.   Rectal:  Deferred  Msk:  Symmetrical without gross deformities. . Pulses:  Normal pulses noted. Extremities:  Without clubbing or edema. Neurologic:  Alert and  oriented x4;  grossly normal neurologically. Skin:  Intact without significant lesions or rashes, few spider angiectasia across the upper chest. Psych:  Alert and cooperative. Normal mood and affect.  Intake/Output from previous day: No intake/output data recorded. Intake/Output this shift: No intake/output data recorded.  Lab Results: Recent Labs    06/15/19 2040  WBC 8.1  HGB 14.4  HCT 39.8  PLT 284   BMET Recent Labs    06/15/19 2040 06/16/19 0647  NA 135 136  K 3.5 4.2  CL 99 103  CO2 25 26  GLUCOSE 116* 94  BUN 11 10  CREATININE 0.70 0.68  CALCIUM 9.1 9.2   LFT Recent Labs    06/16/19 0647  PROT 7.0  ALBUMIN 3.6  AST 1,280*  ALT 2,107*  ALKPHOS 163*  BILITOT 12.7*   PT/INR Recent Labs    06/15/19 2142  LABPROT 13.1  INR 1.0   Hepatitis Panel No results for input(s): HEPBSAG, HCVAB, HEPAIGM, HEPBIGM in the last 72 hours.   IMPRESSION:  #8 43 year old male with onset 12 days ago with epigastric discomfort/indigestion fairly constant,associated with decrease in appetite, and darkening of urine.  Found to be significantly  jaundiced in the ER yesterday with T bili of 10.9 and marked transaminitis.  Upper abdominal ultrasound and CT scan both show probable gallbladder wall thickening and some pericholecystic fluid, with normal CBD, and normal-appearing liver.  Severe transaminitis is more consistent with an acute hepatitis, or other acute liver injury with hepatotoxicity -rule out viral, rule out underlying autoimmune liver disease with acute hepatitis  #2 diffuse arthralgias-x1 year with worsening over the past few months  Plan; HIDA scan is scheduled for this afternoon, await results  Multiple labs have been ordered and are pending including autoimmune markers, ferritin and iron studies. Acute hepatitis panel ordered on admit and still pending.  No evidence of fulminant liver failure at present, will follow INR daily  Thank you for the consult, we will follow with you   Donal Lynam PA-C  06/16/2019, 11:18 AM

## 2019-06-16 NOTE — H&P (Signed)
History and Physical    Antonio Chapman DPO:[id number] DOB: 06-13-1976 DOA: 06/15/2019  PCP: Janith Lima, MD (Confirm with patient/family/NH records and if not entered, this has to be entered at [facility] point of entry) Patient coming from: Coming from home  I have personally briefly reviewed patient's old medical records in [place]  Chief Complaint: painless jaundice  HPI: Antonio Chapman is a 43 y.o. male with medical history significant of rotator cuff tear but otherwise healthy. He noticed that he looked a little yellow and came to [facility] ED for evaluation.He has been totally asymptomatic: no fever, sweats, chills, weight loss, N/V. He denies IVDU, denies EtOH use.    ED Course: Hemodynamically stable. Labs revealed marked elevation LFTs ALT>AST, Abd U/S with thickened GB wall but no stones, CT abd - negative except for thick wall GB, pericholic fluid, Hep panel pending. ED-MD called Dr. Ardis Hughs on call for [facility] who recommended admission to trend LFTs and to establish GI care.  Review of Systems: As per HPI otherwise 10 point review of systems negative.    History reviewed. No pertinent past medical history.  Past Surgical History:  Procedure Laterality Date  . 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 He was married for 14 years, divorced now. He has 3 children from 11 to 6. They live with their mother in [place]. He now has a current relationship. He works as a Freight forwarder for [employer].   reports that he has been smoking cigarettes. He has been smoking about 1.00 pack per day. He has never used smokeless tobacco. He reports current drug use. Drug: Marijuana. He reports that he does not drink alcohol.  No Known Allergies  History reviewed. No pertinent family history.   Prior to Admission medications   Medication Sig Start Date End Date Taking? Authorizing Provider  acetaminophen (TYLENOL) 500 MG tablet Take 1,000 mg by mouth every 6  (six) hours as needed for mild pain, moderate pain or headache.   Yes [provider]  Melatonin 10 MG CAPS Take 10 mg by mouth at bedtime as needed (sleep).   Yes [provider]  naproxen sodium (ALEVE) 220 MG tablet Take 440 mg by mouth 2 (two) times daily as needed (pain).   Yes [provider]  omeprazole (PRILOSEC) 20 MG capsule Take 20 mg by mouth daily.   Yes [provider]    Physical Exam: Vitals:   06/16/19 0000 06/16/19 0015 06/16/19 0030 06/16/19 0100  BP: 120/74  110/73 112/75  Pulse: (!) 135 88 74 77  Resp: (!) 23 20 (!) 21 19  Temp:      TempSrc:      SpO2: 97% 97% 98% 97%  Weight:      Height:        Constitutional: NAD, calm, comfortable Vitals:   06/16/19 0000 06/16/19 0015 06/16/19 0030 06/16/19 0100  BP: 120/74  110/73 112/75  Pulse: (!) 135 88 74 77  Resp: (!) 23 20 (!) 21 19  Temp:      TempSrc:      SpO2: 97% 97% 98% 97%  Weight:      Height:       General: - WNWD man in no distress Eyes: PERRL, lids normal, bulbar conjunctiva with mild icterus ENMT: Mucous membranes are moist. Posterior pharynx clear of any exudate or lesions.Normal dentition.  Neck: normal, supple, no masses, no thyromegaly Respiratory: clear to auscultation  bilaterally, no wheezing, no crackles. Normal respiratory effort. No accessory muscle use.  Cardiovascular: Regular rate and rhythm, no murmurs / rubs / gallops. No extremity edema. 2+ pedal pulses. No carotid bruits.  Abdomen: tenderness RUQ to deep palpation,no palpable liver edger, no masses palpated. No splenomegaly. Bowel sounds positive.  Musculoskeletal: no clubbing / cyanosis. No joint deformity upper and lower extremities. Good ROM, no contractures. Normal muscle tone.  Skin: no rashes, lesions, ulcers. No induration  Arm sleeve right Neurologic: CN 2-12 grossly intact. Sensation intact. Strength 5/5 in all 4.  Psychiatric: Normal judgment and insight. Alert and oriented x 3. Normal  mood.     Labs on Admission: I have personally reviewed following labs and imaging studies  CBC: Recent Labs  Lab 06/15/19 2040  WBC 8.1  HGB 14.4  HCT 39.8  MCV 87.9  PLT 284   Basic Metabolic Panel: Recent Labs  Lab 06/15/19 2040  NA 135  K 3.5  CL 99  CO2 25  GLUCOSE 116*  BUN 11  CREATININE 0.70  CALCIUM 9.1   GFR: Estimated Creatinine Clearance: 110.2 mL/min (by C-G formula based on SCr of 0.7 mg/dL). Liver Function Tests: Recent Labs  Lab 06/15/19 2040  AST 1,289*  ALT 2,257*  ALKPHOS 186*  BILITOT 10.9*  PROT 6.8  ALBUMIN 3.7   Recent Labs  Lab 06/15/19 2040  LIPASE 41   No results for input(s): AMMONIA in the last 168 hours. Coagulation Profile: Recent Labs  Lab 06/15/19 2142  INR 1.0   Cardiac Enzymes: No results for input(s): CKTOTAL, CKMB, CKMBINDEX, TROPONINI in the last 168 hours. BNP (last 3 results) No results for input(s): PROBNP in the last 8760 hours. HbA1C: No results for input(s): HGBA1C in the last 72 hours. CBG: No results for input(s): GLUCAP in the last 168 hours. Lipid Profile: No results for input(s): CHOL, HDL, LDLCALC, TRIG, CHOLHDL, LDLDIRECT in the last 72 hours. Thyroid Function Tests: No results for input(s): TSH, T4TOTAL, FREET4, T3FREE, THYROIDAB in the last 72 hours. Anemia Panel: No results for input(s): VITAMINB12, FOLATE, FERRITIN, TIBC, IRON, RETICCTPCT in the last 72 hours. Urine analysis:    Component Value Date/Time   COLORURINE AMBER (A) 06/15/2019 2040   APPEARANCEUR CLEAR 06/15/2019 2040   LABSPEC 1.008 06/15/2019 2040   PHURINE 6.0 06/15/2019 2040   GLUCOSEU NEGATIVE 06/15/2019 2040   HGBUR NEGATIVE 06/15/2019 2040   BILIRUBINUR NEGATIVE 06/15/2019 2040   KETONESUR NEGATIVE 06/15/2019 2040   PROTEINUR NEGATIVE 06/15/2019 2040   NITRITE NEGATIVE 06/15/2019 2040   LEUKOCYTESUR NEGATIVE 06/15/2019 2040    Radiological Exams on Admission: [place] Abdomen Complete  Result Date: 06/15/2019  CLINICAL DATA:  Jaundice EXAM: ABDOMEN ULTRASOUND COMPLETE COMPARISON:  None. FINDINGS: Gallbladder: The gallbladder is largely decompressed at the time of sonographic imaging which results in suboptimal evaluation. There is borderline gallbladder wall thickening measuring up to 5 mm in maximal thickness. There is a trace amount of pericholecystic fluid in the gallbladder fossa as well. Sonographic Murphy sign however is reportedly negative. No visible calcified gallstones are evident. Common bile duct: Diameter: 3.5 mm, normal Liver: No focal lesion identified. Within normal limits in parenchymal echogenicity. Portal vein is patent on color Doppler imaging with normal direction of blood flow towards the liver. IVC: No abnormality visualized. Pancreas: Visualized portion unremarkable. Spleen: Size and appearance within normal limits. Right Kidney: Length: 11.9 cm. Echogenicity within normal limits. No mass or hydronephrosis visualized. Left Kidney: Length: 11.4 cm. Echogenicity within normal limits. No mass  or hydronephrosis visualized. Abdominal aorta: No aneurysm visualized. Normal caliber at 2.4 cm in the mid segment. Other findings: None. IMPRESSION: Gallbladder is largely decompressed at the time of exam with mild wall thickening which may be related to this underdistention. A trace amount of pericholecystic fluid is nonspecific particularly given the negative sonographic Murphy sign. If there is persisting clinical concern for cholecystitis, could repeat limited right upper quadrant ultrasound in the fasting state or pursue a HIDA scan when available. Otherwise unremarkable abdominal ultrasound. Electronically Signed   By: Kreg Shropshire M.D.   On: 06/15/2019 22:22   Ct Abdomen Pelvis W Contrast  Result Date: 06/16/2019 CLINICAL DATA:  Painless jaundice, fatigue and weight loss EXAM: CT ABDOMEN AND PELVIS WITH CONTRAST TECHNIQUE: Multidetector CT imaging of the abdomen and pelvis was performed using the  standard protocol following bolus administration of intravenous contrast. CONTRAST:  OMNIPAQUE IOHEXOL 300 MG/ML  SOLN COMPARISON:  Abdominal ultrasound June 15, 2019 FINDINGS: Lower chest: Lung bases are clear. Normal heart size. No pericardial effusion. Hepatobiliary: No suspicious hepatic lesions. The gallbladder is largely decompressed at the time exam however gallbladder wall thickening measures up to 6 mm in maximal thickness. There is adjacent pericholecystic inflammation and fluid. Small amount of fluid tracks along the inferior liver as well. Pancreas: Unremarkable. No pancreatic ductal dilatation or surrounding inflammatory changes. Spleen: Normal in size without focal abnormality. Adrenals/Urinary Tract: Adrenal glands are unremarkable. Kidneys are normal, without renal calculi, focal lesion, or hydronephrosis. Mild bladder wall thickening with faint perivesicular haze. Stomach/Bowel: Distal esophagus, stomach and duodenal sweep are unremarkable. No bowel wall thickening or dilatation. No evidence of obstruction. A normal appendix is visualized. No colonic dilatation or wall thickening. Vascular/Lymphatic: Atherosclerotic plaque within the normal caliber aorta. No aneurysm or ectasia. No suspicious or enlarged lymph nodes in the included lymphatic chains. Reproductive: The prostate and seminal vesicles are unremarkable. Other: Small amount of pericholecystic and right upper quadrant fluid. No free intraperitoneal air. No bowel containing hernias. Musculoskeletal: No acute osseous abnormality or suspicious osseous lesion. Mild discogenic and facet degenerative changes are present in the included portions of the thoracic lumbar spine. IMPRESSION: 1. The gallbladder is largely decompressed at the time exam however gallbladder wall thickening measures up to 6 mm in maximal thickness. There is adjacent pericholecystic and right upper quadrant fluid. Findings are concerning for acute cholecystitis in  the appropriate clinical setting 2. Mild bladder wall thickening with faint perivesicular haze, suggestive of cystitis. Correlate with urinary symptoms and consider urinalysis. 3. Aortic Atherosclerosis (ICD10-I70.0). Electronically Signed   By: Kreg Shropshire M.D.   On: 06/16/2019 00:07    EKG: Independently reviewed. none  Assessment/Plan Active Problems:   Painless jaundice   1. Painless jaundice - asymptomatic. Studies negative for stone or turmor. LFTs elevated. Picture suggestive of acute hepatitis, most likely hepatitis A but B or C cannot be ruled out. Obstructing mass less likely. Plan Medical admission  F/u CMet  GI consult  Routine infectious precautions.  DVT prophylaxis: lovenox (Lovenox/Heparin/SCD's/anticoagulated/None (if comfort care) Code Status: full code  (Full/Partial (specify details) Family Communication: SO informed of working dx. (Specify name, relationship. Do not write "discussed with patient". Specify tel # if discussed over the phone) Disposition Plan: home 2-3 days (specify when and where you expect patient to be discharged) Consults called: GI-Dr. Christella Hartigan was called by ED-MD (with names) Admission status: inpatient (inpatient / obs / tele / medical floor / SDU)   Illene Regulus MD [facility] Pager [phone]  If 7PM-7AM, please contact night-coverage [email or web address] Password [email or web address]  06/16/2019, 2:32 AM

## 2019-06-17 ENCOUNTER — Other Ambulatory Visit: Payer: Self-pay | Admitting: Pharmacist

## 2019-06-17 DIAGNOSIS — B169 Acute hepatitis B without delta-agent and without hepatic coma: Principal | ICD-10-CM

## 2019-06-17 DIAGNOSIS — R110 Nausea: Secondary | ICD-10-CM

## 2019-06-17 DIAGNOSIS — S36119D Unspecified injury of liver, subsequent encounter: Secondary | ICD-10-CM

## 2019-06-17 DIAGNOSIS — F17210 Nicotine dependence, cigarettes, uncomplicated: Secondary | ICD-10-CM | POA: Diagnosis present

## 2019-06-17 DIAGNOSIS — L818 Other specified disorders of pigmentation: Secondary | ICD-10-CM

## 2019-06-17 DIAGNOSIS — K5900 Constipation, unspecified: Secondary | ICD-10-CM

## 2019-06-17 DIAGNOSIS — R12 Heartburn: Secondary | ICD-10-CM

## 2019-06-17 LAB — COMPREHENSIVE METABOLIC PANEL
ALT: 2289 U/L — ABNORMAL HIGH (ref 0–44)
AST: 1598 U/L — ABNORMAL HIGH (ref 15–41)
Albumin: 3.3 g/dL — ABNORMAL LOW (ref 3.5–5.0)
Alkaline Phosphatase: 145 U/L — ABNORMAL HIGH (ref 38–126)
Anion gap: 10 (ref 5–15)
BUN: 10 mg/dL (ref 6–20)
CO2: 23 mmol/L (ref 22–32)
Calcium: 9.1 mg/dL (ref 8.9–10.3)
Chloride: 104 mmol/L (ref 98–111)
Creatinine, Ser: 0.53 mg/dL — ABNORMAL LOW (ref 0.61–1.24)
GFR calc Af Amer: >60 mL/min (ref >60)
GFR calc non Af Amer: >60 mL/min (ref >60)
Glucose, Bld: 91 mg/dL (ref 70–99)
Potassium: 4.4 mmol/L (ref 3.5–5.1)
Sodium: 137 mmol/L (ref 135–145)
Total Bilirubin: 12.3 mg/dL — ABNORMAL HIGH (ref 0.3–1.2)
Total Protein: 6.2 g/dL — ABNORMAL LOW (ref 6.5–8.1)

## 2019-06-17 LAB — ANTI-SMOOTH MUSCLE ANTIBODY, IGG: F-Actin IgG: 8 Units (ref 0–19)

## 2019-06-17 LAB — PROTIME-INR
INR: 1 (ref 0.8–1.2)
Prothrombin Time: 13.1 seconds (ref 11.4–15.2)

## 2019-06-17 LAB — HEPATITIS PANEL, ACUTE
HCV Ab: <0.1 s/co ratio (ref 0.0–0.9)
Hep A IgM: NEGATIVE
Hep B C IgM: POSITIVE — AB
Hepatitis B Surface Ag: POSITIVE — AB

## 2019-06-17 LAB — HEPATITIS A ANTIBODY, TOTAL: hep A Total Ab: NEGATIVE

## 2019-06-17 LAB — MITOCHONDRIAL ANTIBODIES: Mitochondrial M2 Ab, IgG: <20 Units (ref 0.0–20.0)

## 2019-06-17 LAB — HCV RNA QUANT: HCV Quantitative: NOT DETECTED IU/mL (ref >50)

## 2019-06-17 LAB — HEPATITIS B CORE ANTIBODY, TOTAL: Hep B Core Total Ab: POSITIVE — AB

## 2019-06-17 LAB — CORTISOL: Cortisol, Plasma: 9.9 ug/dL

## 2019-06-17 LAB — ANA W/REFLEX IF POSITIVE: Anti Nuclear Antibody (ANA): NEGATIVE

## 2019-06-17 MED ORDER — SODIUM CHLORIDE 0.9% FLUSH: 3.0000 mL | Freq: Two times a day (BID) | INTRAVENOUS | Status: DC

## 2019-06-17 NOTE — Progress Notes (Signed)
PROGRESS NOTE    Antonio Chapman  RCV:[id number] DOB: 1976/02/14 DOA: 06/15/2019 PCP: Janith Lima, MD  Brief Narrative:43 y.o.malewith medical history significant ofrotator cuff tear but otherwise healthy. He noticed that he looked a little yellow and came to [facility] ED for evaluation.He has been totally asymptomatic: no fever, sweats, chills, weight loss, N/V. He denies IVDU, denies EtOH use.  ED Course:Hemodynamically stable. Labs revealed marked elevation LFTs ALT>AST, Abd U/S with thickened GB wall but no stones, CT abd - negative except for thick wall GB, pericholic fluid, Hep panel pending. ED-MD called Dr. Ardis Hughs on call for [facility] who recommended admission to trend LFTs and to establish GI care.   Assessment & Plan:   Principal Problem:   Acute hepatitis B Active Problems:   Painless jaundice   Cigarette smoker   #1  Painless jaundice with transaminitis secondary to acute hepatitis B.  HIDA scan negative.  Seen in consult by GI and infectious disease.  He has positive hepatitis B surface antigen hepatitis B core antibody positive.  HIV negative.  Hepatitis C pending.  Patient to be started on tenofovir.  ID pharmacist to check of its covered by insurance.  He is aware his significant other needs to be tested.  Transaminases still trending up.  Follow-up CMP tomorrow.    Estimated body mass index is 26.76 kg/m as calculated from the following:   Height as of this encounter: 5\' 7"  (1.702 m).   Weight as of this encounter: 77.5 kg.    Subjective:  Resting in bed and doing exercises no complaints no nausea vomiting diarrhea abdominal pain Objective: Vitals:   06/16/19 1316 06/16/19 2152 06/17/19 0633 06/17/19 1411  BP: 116/85 117/82 112/75 105/67  Pulse: 79 81 71 75  Resp: 16 17 17 17   Temp: 98.7 F (37.1 C) 98.4 F (36.9 C) 98 F (36.7 C) 98.5 F (36.9 C)  TempSrc: Oral Oral Oral Oral  SpO2: 98% 98% 97% 97%  Weight:      Height:        Intake/Output  Summary (Last 24 hours) at 06/17/2019 1518 Last data filed at 06/17/2019 1218 Gross per 24 hour  Intake 2706.01 ml  Output -  Net 2706.01 ml   Filed Weights   06/15/19 2028 06/16/19 0351  Weight: 74.8 kg 77.5 kg    Examination: Icteric sclera General exam: Appears calm and comfortable  Respiratory system: Clear to auscultation. Respiratory effort normal. Cardiovascular system: S1 & S2 heard, RRR. No JVD, murmurs, rubs, gallops or clicks. No pedal edema. Gastrointestinal system: Abdomen is nondistended, soft and nontender. No organomegaly or masses felt. Normal bowel sounds heard. Central nervous system: Alert and oriented. No focal neurological deficits. Extremities: Symmetric 5 x 5 power. Skin: No rashes, lesions or ulcers Psychiatry: Judgement and insight appear normal. Mood & affect appropriate.     Data Reviewed: I have personally reviewed following labs and imaging studies  CBC: Recent Labs  Lab 06/15/19 2040  WBC 8.1  HGB 14.4  HCT 39.8  MCV 87.9  PLT 102   Basic Metabolic Panel: Recent Labs  Lab 06/15/19 2040 06/16/19 0647 06/17/19 0614  NA 135 136 137  K 3.5 4.2 4.4  CL 99 103 104  CO2 25 26 23   GLUCOSE 116* 94 91  BUN 11 10 10   CREATININE 0.70 0.68 0.53*  CALCIUM 9.1 9.2 9.1   GFR: Estimated Creatinine Clearance: 110.2 mL/min (A) (by C-G formula based on SCr of 0.53 mg/dL (L)). Liver Function Tests:  Recent Labs  Lab 06/15/19 2040 06/16/19 0647 06/17/19 0614  AST 1,289* 1,280* 1,598*  ALT 2,257* 2,107* 2,289*  ALKPHOS 186* 163* 145*  BILITOT 10.9* 12.7* 12.3*  PROT 6.8 7.0 6.2*  ALBUMIN 3.7 3.6 3.3*   Recent Labs  Lab 06/15/19 2040  LIPASE 41   No results for input(s): AMMONIA in the last 168 hours. Coagulation Profile: Recent Labs  Lab 06/15/19 2142 06/16/19 1813 06/17/19 0607  INR 1.0 1.1 1.0   Cardiac Enzymes: Recent Labs  Lab 06/16/19 0814  CKTOTAL 61   BNP (last 3 results) No results for input(s): PROBNP in the last  8760 hours. HbA1C: No results for input(s): HGBA1C in the last 72 hours. CBG: No results for input(s): GLUCAP in the last 168 hours. Lipid Profile: No results for input(s): CHOL, HDL, LDLCALC, TRIG, CHOLHDL, LDLDIRECT in the last 72 hours. Thyroid Function Tests: Recent Labs    06/16/19 1307  TSH 0.768   Anemia Panel: Recent Labs    06/16/19 1110  FERRITIN 4,521*  TIBC 350  IRON 283*   Sepsis Labs: No results for input(s): PROCALCITON, LATICACIDVEN in the last 168 hours.  Recent Results (from the past 240 hour(s))  SARS Coronavirus 2 [facility](Hospital order, Performed in [facility] lab) Nasopharyngeal Nasopharyngeal Swab     Status: None   Collection Time: 06/15/19 11:14 PM   Specimen: Nasopharyngeal Swab  Result Value Ref Range Status   SARS Coronavirus 2 NEGATIVE NEGATIVE Final    Comment: (NOTE) If result is NEGATIVE SARS-CoV-2 target nucleic acids are NOT DETECTED. The SARS-CoV-2 RNA is generally detectable in upper and lower  respiratory specimens during the acute phase of infection. The lowest  concentration of SARS-CoV-2 viral copies this assay can detect is 250  copies / mL. A negative result does not preclude SARS-CoV-2 infection  and should not be used as the sole basis for treatment or other  patient management decisions.  A negative result may occur with  improper specimen collection / handling, submission of specimen other  than nasopharyngeal swab, presence of viral mutation(s) within the  areas targeted by this assay, and inadequate number of viral copies  (<250 copies / mL). A negative result must be combined with clinical  observations, patient history, and epidemiological information. If result is POSITIVE SARS-CoV-2 target nucleic acids are DETECTED. The SARS-CoV-2 RNA is generally detectable in upper and lower  respiratory specimens dur ing the acute phase of infection.  Positive  results are indicative of active infection with SARS-CoV-2.   Clinical  correlation with patient history and other diagnostic information is  necessary to determine patient infection status.  Positive results do  not rule out bacterial infection or co-infection with other viruses. If result is PRESUMPTIVE POSTIVE SARS-CoV-2 nucleic acids MAY BE PRESENT.   A presumptive positive result was obtained on the submitted specimen  and confirmed on repeat testing.  While 2019 novel coronavirus  (SARS-CoV-2) nucleic acids may be present in the submitted sample  additional confirmatory testing may be necessary for epidemiological  and / or clinical management purposes  to differentiate between  SARS-CoV-2 and other Sarbecovirus currently known to infect humans.  If clinically indicated additional testing with an alternate test  methodology [phone]([id number]) is advised. The SARS-CoV-2 RNA is generally  detectable in upper and lower respiratory sp ecimens during the acute  phase of infection. The expected result is Negative. Fact Sheet for Patients:  BoilerBrush.com.cyhttps://www.fda.gov/media/136312/download Fact Sheet for Healthcare Providers: https://pope.com/https://www.fda.gov/media/136313/download This test is not yet approved  or cleared by the [place] FDA and has been authorized for detection and/or diagnosis of SARS-CoV-2 by FDA under an Emergency Use Authorization (EUA).  This EUA will remain in effect (meaning this test can be used) for the duration of the COVID-19 declaration under Section 564(b)(1) of the Act, 21 U.S.C. section 360bbb-3(b)(1), unless the authorization is terminated or revoked sooner. Performed at [facility], [address] [place], [place] [address]          Radiology Studies: [place] Abdomen Complete  Result Date: 06/15/2019 CLINICAL DATA:  Jaundice EXAM: ABDOMEN ULTRASOUND COMPLETE COMPARISON:  None. FINDINGS: Gallbladder: The gallbladder is largely decompressed at the time of sonographic imaging which results in suboptimal evaluation.  There is borderline gallbladder wall thickening measuring up to 5 mm in maximal thickness. There is a trace amount of pericholecystic fluid in the gallbladder fossa as well. Sonographic Murphy sign however is reportedly negative. No visible calcified gallstones are evident. Common bile duct: Diameter: 3.5 mm, normal Liver: No focal lesion identified. Within normal limits in parenchymal echogenicity. Portal vein is patent on color Doppler imaging with normal direction of blood flow towards the liver. IVC: No abnormality visualized. Pancreas: Visualized portion unremarkable. Spleen: Size and appearance within normal limits. Right Kidney: Length: 11.9 cm. Echogenicity within normal limits. No mass or hydronephrosis visualized. Left Kidney: Length: 11.4 cm. Echogenicity within normal limits. No mass or hydronephrosis visualized. Abdominal aorta: No aneurysm visualized. Normal caliber at 2.4 cm in the mid segment. Other findings: None. IMPRESSION: Gallbladder is largely decompressed at the time of exam with mild wall thickening which may be related to this underdistention. A trace amount of pericholecystic fluid is nonspecific particularly given the negative sonographic Murphy sign. If there is persisting clinical concern for cholecystitis, could repeat limited right upper quadrant ultrasound in the fasting state or pursue a HIDA scan when available. Otherwise unremarkable abdominal ultrasound. Electronically Signed   By: Kreg ShropshirePrice  DeHay M.D.   On: 06/15/2019 22:22   Ct Abdomen Pelvis W Contrast  Result Date: 06/16/2019 CLINICAL DATA:  Painless jaundice, fatigue and weight loss EXAM: CT ABDOMEN AND PELVIS WITH CONTRAST TECHNIQUE: Multidetector CT imaging of the abdomen and pelvis was performed using the standard protocol following bolus administration of intravenous contrast. CONTRAST:  100mL OMNIPAQUE IOHEXOL 300 MG/ML  SOLN COMPARISON:  Abdominal ultrasound June 15, 2019 FINDINGS: Lower chest: Lung bases are  clear. Normal heart size. No pericardial effusion. Hepatobiliary: No suspicious hepatic lesions. The gallbladder is largely decompressed at the time exam however gallbladder wall thickening measures up to 6 mm in maximal thickness. There is adjacent pericholecystic inflammation and fluid. Small amount of fluid tracks along the inferior liver as well. Pancreas: Unremarkable. No pancreatic ductal dilatation or surrounding inflammatory changes. Spleen: Normal in size without focal abnormality. Adrenals/Urinary Tract: Adrenal glands are unremarkable. Kidneys are normal, without renal calculi, focal lesion, or hydronephrosis. Mild bladder wall thickening with faint perivesicular haze. Stomach/Bowel: Distal esophagus, stomach and duodenal sweep are unremarkable. No bowel wall thickening or dilatation. No evidence of obstruction. A normal appendix is visualized. No colonic dilatation or wall thickening. Vascular/Lymphatic: Atherosclerotic plaque within the normal caliber aorta. No aneurysm or ectasia. No suspicious or enlarged lymph nodes in the included lymphatic chains. Reproductive: The prostate and seminal vesicles are unremarkable. Other: Small amount of pericholecystic and right upper quadrant fluid. No free intraperitoneal air. No bowel containing hernias. Musculoskeletal: No acute osseous abnormality or suspicious osseous lesion. Mild discogenic and facet degenerative changes are present in  the included portions of the thoracic lumbar spine. IMPRESSION: 1. The gallbladder is largely decompressed at the time exam however gallbladder wall thickening measures up to 6 mm in maximal thickness. There is adjacent pericholecystic and right upper quadrant fluid. Findings are concerning for acute cholecystitis in the appropriate clinical setting 2. Mild bladder wall thickening with faint perivesicular haze, suggestive of cystitis. Correlate with urinary symptoms and consider urinalysis. 3. Aortic Atherosclerosis  (ICD10-I70.0). Electronically Signed   By: Kreg Shropshire M.D.   On: 06/16/2019 00:07   Nm Hepato W/eject Fract  Result Date: 06/16/2019 CLINICAL DATA:  Painless jaundice for several days. EXAM: NUCLEAR MEDICINE HEPATOBILIARY IMAGING WITH GALLBLADDER EF TECHNIQUE: Sequential images of the abdomen were obtained out to 60 minutes following intravenous administration of radiopharmaceutical. After oral ingestion of Ensure, gallbladder ejection fraction was determined. At 60 min, normal ejection fraction is greater than 33%. RADIOPHARMACEUTICALS:  10.7 mCi Tc-75m  Choletec IV COMPARISON:  None. FINDINGS: Prompt uptake and biliary excretion of activity by the liver is seen. Gallbladder activity is visualized, consistent with patency of cystic duct. Biliary activity passes into small bowel, consistent with patent common bile duct. Calculated gallbladder ejection fraction is 83%. (Normal gallbladder ejection fraction with Ensure is greater than 33%.) IMPRESSION: Normal hepatobiliary imaging with normal gallbladder function. Electronically Signed   By: Amie Portland M.D.   On: 06/16/2019 17:08        Scheduled Meds: . enoxaparin (LOVENOX) injection  40 mg Subcutaneous Q24H  . pantoprazole  40 mg Oral Daily   Continuous Infusions: . sodium chloride 125 mL/hr at 06/17/19 1211  . sodium chloride       LOS: 1 day     Alwyn Ren, MD [facility]  If 7PM-7AM, please contact night-coverage [email or web address] Password [facility] 06/17/2019, 3:18 PM

## 2019-06-17 NOTE — Progress Notes (Signed)
Patient ID: Kelli Egolf, male   DOB: September 25, 1976, 43 y.o.   MRN: [id number]    Progress Note   Subjective  Day # 2 CC: jaundice  Patient says he feels fine.  Denies any nausea or vomiting and has been eating solid food without difficulty.  Denies any abdominal pain today  HIDA - normal  INR1.0 Tbili 12.3/AST 1598/ ALT 2289  Ferritin 4500  Fe 283/sat 81 HIV - neg Hep A ab -neg  Hep C - neg Hep B S Ag + Hep B Core ab + Hep B C IGM +   Objective   Vital signs in last 24 hours: Temp:  [98 F (36.7 C)-98.7 F (37.1 C)] 98 F (36.7 C) (09/15 4010) Pulse Rate:  [71-81] 71 (09/15 0633) Resp:  [16-17] 17 (09/15 2725) BP: (112-117)/(75-85) 112/75 (09/15 3664) SpO2:  [97 %-98 %] 97 % (09/15 4034) Last BM Date: 06/15/19 General:    white male  in NAD Jaundiced Heart:  Regular rate and rhythm; no murmurs Lungs: Respirations even and unlabored, lungs CTA bilaterally Abdomen:  Soft, nontender and nondistended. Normal bowel sounds. Extremities:  Without edema. Neurologic:  Alert and oriented,  grossly normal neurologically. Psych:  Cooperative. Normal mood and affect.  Intake/Output from previous day: 09/14 0701 - 09/15 0700 In: 2226 [P.O.:480; I.V.:1746] Out: -  Intake/Output this shift: No intake/output data recorded.  Lab Results: Recent Labs    06/15/19 2040  WBC 8.1  HGB 14.4  HCT 39.8  PLT 284   BMET Recent Labs    06/15/19 2040 06/16/19 0647 06/17/19 0614  NA 135 136 137  K 3.5 4.2 4.4  CL 99 103 104  CO2 25 26 23   GLUCOSE 116* 94 91  BUN 11 10 10   CREATININE 0.70 0.68 0.53*  CALCIUM 9.1 9.2 9.1   LFT Recent Labs    06/17/19 0614  PROT 6.2*  ALBUMIN 3.3*  AST 1,598*  ALT 2,289*  ALKPHOS 145*  BILITOT 12.3*   PT/INR Recent Labs    06/16/19 1813 06/17/19 0607  LABPROT 13.6 13.1  INR 1.1 1.0    Studies/Results: US Abdomen Complete  Result Date: 06/15/2019 CLINICAL DATA:  Jaundice EXAM: ABDOMEN ULTRASOUND COMPLETE COMPARISON:  None.  FINDINGS: Gallbladder: The gallbladder is largely decompressed at the time of sonographic imaging which results in suboptimal evaluation. There is borderline gallbladder wall thickening measuring up to 5 mm in maximal thickness. There is a trace amount of pericholecystic fluid in the gallbladder fossa as well. Sonographic Murphy sign however is reportedly negative. No visible calcified gallstones are evident. Common bile duct: Diameter: 3.5 mm, normal Liver: No focal lesion identified. Within normal limits in parenchymal echogenicity. Portal vein is patent on color Doppler imaging with normal direction of blood flow towards the liver. IVC: No abnormality visualized. Pancreas: Visualized portion unremarkable. Spleen: Size and appearance within normal limits. Right Kidney: Length: 11.9 cm. Echogenicity within normal limits. No mass or hydronephrosis visualized. Left Kidney: Length: 11.4 cm. Echogenicity within normal limits. No mass or hydronephrosis visualized. Abdominal aorta: No aneurysm visualized. Normal caliber at 2.4 cm in the mid segment. Other findings: None. IMPRESSION: Gallbladder is largely decompressed at the time of exam with mild wall thickening which may be related to this underdistention. A trace amount of pericholecystic fluid is nonspecific particularly given the negative sonographic Murphy sign. If there is persisting clinical concern for cholecystitis, could repeat limited right upper quadrant ultrasound in the fasting state or pursue a HIDA scan when available. Otherwise  unremarkable abdominal ultrasound. Electronically Signed   By: Kreg Shropshire M.D.   On: 06/15/2019 22:22   Ct Abdomen Pelvis W Contrast  Result Date: 06/16/2019 CLINICAL DATA:  Painless jaundice, fatigue and weight loss EXAM: CT ABDOMEN AND PELVIS WITH CONTRAST TECHNIQUE: Multidetector CT imaging of the abdomen and pelvis was performed using the standard protocol following bolus administration of intravenous contrast.  CONTRAST:  OMNIPAQUE IOHEXOL 300 MG/ML  SOLN COMPARISON:  Abdominal ultrasound June 15, 2019 FINDINGS: Lower chest: Lung bases are clear. Normal heart size. No pericardial effusion. Hepatobiliary: No suspicious hepatic lesions. The gallbladder is largely decompressed at the time exam however gallbladder wall thickening measures up to 6 mm in maximal thickness. There is adjacent pericholecystic inflammation and fluid. Small amount of fluid tracks along the inferior liver as well. Pancreas: Unremarkable. No pancreatic ductal dilatation or surrounding inflammatory changes. Spleen: Normal in size without focal abnormality. Adrenals/Urinary Tract: Adrenal glands are unremarkable. Kidneys are normal, without renal calculi, focal lesion, or hydronephrosis. Mild bladder wall thickening with faint perivesicular haze. Stomach/Bowel: Distal esophagus, stomach and duodenal sweep are unremarkable. No bowel wall thickening or dilatation. No evidence of obstruction. A normal appendix is visualized. No colonic dilatation or wall thickening. Vascular/Lymphatic: Atherosclerotic plaque within the normal caliber aorta. No aneurysm or ectasia. No suspicious or enlarged lymph nodes in the included lymphatic chains. Reproductive: The prostate and seminal vesicles are unremarkable. Other: Small amount of pericholecystic and right upper quadrant fluid. No free intraperitoneal air. No bowel containing hernias. Musculoskeletal: No acute osseous abnormality or suspicious osseous lesion. Mild discogenic and facet degenerative changes are present in the included portions of the thoracic lumbar spine. IMPRESSION: 1. The gallbladder is largely decompressed at the time exam however gallbladder wall thickening measures up to 6 mm in maximal thickness. There is adjacent pericholecystic and right upper quadrant fluid. Findings are concerning for acute cholecystitis in the appropriate clinical setting 2. Mild bladder wall thickening with  faint perivesicular haze, suggestive of cystitis. Correlate with urinary symptoms and consider urinalysis. 3. Aortic Atherosclerosis (ICD10-I70.0). Electronically Signed   By: Kreg Shropshire M.D.   On: 06/16/2019 00:07   Nm Hepato W/eject Fract  Result Date: 06/16/2019 CLINICAL DATA:  Painless jaundice for several days. EXAM: NUCLEAR MEDICINE HEPATOBILIARY IMAGING WITH GALLBLADDER EF TECHNIQUE: Sequential images of the abdomen were obtained out to 60 minutes following intravenous administration of radiopharmaceutical. After oral ingestion of Ensure, gallbladder ejection fraction was determined. At 60 min, normal ejection fraction is greater than 33%. RADIOPHARMACEUTICALS:  10.7 mCi Tc-7m  Choletec IV COMPARISON:  None. FINDINGS: Prompt uptake and biliary excretion of activity by the liver is seen. Gallbladder activity is visualized, consistent with patency of cystic duct. Biliary activity passes into small bowel, consistent with patent common bile duct. Calculated gallbladder ejection fraction is 83%. (Normal gallbladder ejection fraction with Ensure is greater than 33%.) IMPRESSION: Normal hepatobiliary imaging with normal gallbladder function. Electronically Signed   By: Amie Portland M.D.   On: 06/16/2019 17:08       Assessment / Plan:    #19 43 year old white male admitted with new onset jaundice and marked transaminitis  Picture most consistent with an acute hepatitis. He did have some gallbladder wall thickening on ultrasound and CT, however HIDA was normal.  Multiple labs have returned today and noted to have positive hepatitis B surface antigen, and hep B core antibody consistent with acute hepatitis B.  Fortunately INR remains normal at 1.0, no evidence for hepatic decompensation  Ferritin markedly  elevated , serum iron 283.  Ferritin may be an acute phase reactant, but with high serum iron also need to consider underlying hemochromatosis  Dr. Orvan Falconerampbell has seen and evaluated this  morning.  HIV is negative, hep C RNA quant still pending  Plan; Await pending labs including hepatitis B e antigen, e antibody DNA viral load and hepatitis D antibody. Would like to keep in the hospital at least 1 more day, repeat INR and LFTs to assure is not developing any signs of decompensation. Patient is aware that his partner (S) need to be aware, and tested for hepatitis B. We will hold on starting treatment in short-term, in the meantime the ID pharmacist, has been checked asked to check on his coverage for potential tenofovir. He has questions about timing of return to work, which we can address prior to discharge.    Active Problems:   Painless jaundice     LOS: 1 day   Shriyans Kuenzi PA-C 06/17/2019, 9:08 AM

## 2019-06-17 NOTE — Consult Note (Addendum)
[facility]    Date of Admission:  06/15/2019          Reason for Consult: Acute hepatitis B    Referring Provider: Mike GipAmy Esterwood PA Primary Care Provider: Dr. Sanda Lingerhomas Jones  Assessment: Mr. Antonio Chapman has acute hepatitis B.  He has significant elevation of transaminases and bilirubin but no evidence of liver failure.  I shared with him that there is a low (probably less than 1%) risk of liver failure and that only about 5% of people with acute hepatitis B will go on to develop chronic hepatitis B.  I also told him that optimal use of antiviral therapy in patients with acute hepatitis B is largely unknown due to lack of adequate studies.  However, current therapies for hepatitis B are generally very effective and have low risks of adverse events and I would certainly consider treatment for him.  He said that he understands the unknowns and is willing to be treated.  Plan: 1. I will ask our ID pharmacist, Cassie Kuppelweiser, to see if his insurance covers tenofovir alafenamide 2. Await results of HIV antibody and hepatitis C viral load 3. Check hepatitis B e antigen, e antibody, DNA viral load and hepatitis D antibody 4. I recommended that his significant other be tested for hepatitis B (surface antigen and antibody) and for them to avoid unprotected sex in the near future  Principal Problem:   Acute hepatitis B Active Problems:   Painless jaundice   Cigarette smoker   Scheduled Meds: . enoxaparin (LOVENOX) injection  40 mg Subcutaneous Q24H  . pantoprazole  40 mg Oral Daily   Continuous Infusions: . sodium chloride 125 mL/hr at 06/16/19 1300  . sodium chloride     PRN Meds:.sodium chloride, acetaminophen, Melatonin, sodium chloride flush  HPI: Antonio Chapman is a 43 y.o. male who was in good health until about 2 weeks ago when he began to develop progressive fatigue, indigestion and epigastric discomfort.  His significant other noticed that he was  jaundiced leading to admission 2 days ago.  His AST was 1289.  His ALT was 2257.  His alkaline phosphatase was 186 and his total bilirubin was 10.  His INR is normal.  His hepatitis B surface antigen and core IgM are positive indicative of acute hepatitis B.  His hepatitis A and C antibodies are negative.   He denies any known risk factors for hepatitis B.  He has had unprotected sex with his significant other but has no other partners in the recent past.  She believes she is immune to hepatitis B and has been vaccinated.  He smokes marijuana but denies ever injecting drugs.  He has never had a blood transfusion.  He has tattoos but his last tattoo was 2 years ago.  He works as a Merchandiser, retailsupervisor at [employer].   Review of Systems: Review of Systems  Constitutional: Positive for malaise/fatigue. Negative for chills, diaphoresis, fever and weight loss.  HENT: Negative for congestion and sore throat.   Eyes:       Jaundice.  Respiratory: Negative for cough, sputum production and shortness of breath.   Cardiovascular: Negative for chest pain.  Gastrointestinal: Positive for abdominal pain, constipation, heartburn and nausea. Negative for diarrhea and vomiting.  Genitourinary: Negative for dysuria.       Dark urine recently.  Musculoskeletal: Negative for back pain, joint pain and myalgias.  Skin: Negative for rash.  Neurological: Negative for headaches.  Psychiatric/Behavioral:  Negative for substance abuse.    History reviewed. No pertinent past medical history.  Social History   Tobacco Use  . Smoking status: Heavy Tobacco Smoker    Packs/day: 1.00    Types: Cigarettes  . Smokeless tobacco: Never Used  Substance Use Topics  . Alcohol use: Never    Frequency: Never  . Drug use: Yes    Types: Marijuana    History reviewed. No pertinent family history. No Known Allergies  OBJECTIVE: Blood pressure 112/75, pulse 71, temperature 98 F (36.7 C), temperature source Oral, resp. rate 17,  height 5\' 7"  (1.702 m), weight 77.5 kg, SpO2 97 %.  Physical Exam Constitutional:      Comments: He is sitting up in his room.  His significant other is present.  Eyes:     General: Scleral icterus present.  Cardiovascular:     Rate and Rhythm: Normal rate and regular rhythm.     Heart sounds: No murmur.  Pulmonary:     Effort: Pulmonary effort is normal.     Breath sounds: Normal breath sounds.  Abdominal:     General: There is no distension.     Palpations: Abdomen is soft. There is no mass.     Tenderness: There is no abdominal tenderness.  Musculoskeletal:        General: No swelling or tenderness.  Skin:    Findings: No rash.  Psychiatric:     Comments: He says that he is "worried".     Lab Results Lab Results  Component Value Date   WBC 8.1 06/15/2019   HGB 14.4 06/15/2019   HCT 39.8 06/15/2019   MCV 87.9 06/15/2019   PLT 284 06/15/2019    Lab Results  Component Value Date   CREATININE 0.53 (L) 06/17/2019   BUN 10 06/17/2019   NA 137 06/17/2019   K 4.4 06/17/2019   CL 104 06/17/2019   CO2 23 06/17/2019    Lab Results  Component Value Date   ALT 2,289 (H) 06/17/2019   AST 1,598 (H) 06/17/2019   ALKPHOS 145 (H) 06/17/2019   BILITOT 12.3 (H) 06/17/2019     Microbiology: Recent Results (from the past 240 hour(s))  SARS Coronavirus 2 [facility] order, Performed in [facility] hospital lab) Nasopharyngeal Nasopharyngeal Swab     Status: None   Collection Time: 06/15/19 11:14 PM   Specimen: Nasopharyngeal Swab  Result Value Ref Range Status   SARS Coronavirus 2 NEGATIVE NEGATIVE Final    Comment: (NOTE) If result is NEGATIVE SARS-CoV-2 target nucleic acids are NOT DETECTED. The SARS-CoV-2 RNA is generally detectable in upper and lower  respiratory specimens during the acute phase of infection. The lowest  concentration of SARS-CoV-2 viral copies this assay can detect is 250  copies / mL. A negative result does not preclude SARS-CoV-2 infection  and  should not be used as the sole basis for treatment or other  patient management decisions.  A negative result may occur with  improper specimen collection / handling, submission of specimen other  than nasopharyngeal swab, presence of viral mutation(s) within the  areas targeted by this assay, and inadequate number of viral copies  (<250 copies / mL). A negative result must be combined with clinical  observations, patient history, and epidemiological information. If result is POSITIVE SARS-CoV-2 target nucleic acids are DETECTED. The SARS-CoV-2 RNA is generally detectable in upper and lower  respiratory specimens dur ing the acute phase of infection.  Positive  results are indicative of active infection  with SARS-CoV-2.  Clinical  correlation with patient history and other diagnostic information is  necessary to determine patient infection status.  Positive results do  not rule out bacterial infection or co-infection with other viruses. If result is PRESUMPTIVE POSTIVE SARS-CoV-2 nucleic acids MAY BE PRESENT.   A presumptive positive result was obtained on the submitted specimen  and confirmed on repeat testing.  While 2019 novel coronavirus  (SARS-CoV-2) nucleic acids may be present in the submitted sample  additional confirmatory testing may be necessary for epidemiological  and / or clinical management purposes  to differentiate between  SARS-CoV-2 and other Sarbecovirus currently known to infect humans.  If clinically indicated additional testing with an alternate test  methodology [phone]) is advised. The SARS-CoV-2 RNA is generally  detectable in upper and lower respiratory sp ecimens during the acute  phase of infection. The expected result is Negative. Fact Sheet for Patients:  BoilerBrush.com.cy Fact Sheet for Healthcare Providers: https://pope.com/ This test is not yet approved or cleared by the [place] FDA and has been  authorized for detection and/or diagnosis of SARS-CoV-2 by FDA under an Emergency Use Authorization (EUA).  This EUA will remain in effect (meaning this test can be used) for the duration of the COVID-19 declaration under Section 564(b)(1) of the Act, 21 U.S.C. section 360bbb-3(b)(1), unless the authorization is terminated or revoked sooner. Performed at [facility], [address] [place], [place] [address]     Cliffton Asters, MD [facility] [phone] pager   [phone] cell 06/17/2019, 11:19 AM

## 2019-06-17 NOTE — Progress Notes (Signed)
Benefits Investigation.

## 2019-06-18 DIAGNOSIS — R109 Unspecified abdominal pain: Secondary | ICD-10-CM

## 2019-06-18 DIAGNOSIS — R7401 Elevation of levels of liver transaminase levels: Secondary | ICD-10-CM | POA: Diagnosis present

## 2019-06-18 LAB — CBC
HCT: 40.2 % (ref 39.0–52.0)
Hemoglobin: 14.2 g/dL (ref 13.0–17.0)
MCH: 31.8 pg (ref 26.0–34.0)
MCHC: 35.3 g/dL (ref 30.0–36.0)
MCV: 90.1 fL (ref 80.0–100.0)
Platelets: 300 10*3/uL (ref 150–400)
RBC: 4.46 MIL/uL (ref 4.22–5.81)
RDW: 18.1 % — ABNORMAL HIGH (ref 11.5–15.5)
WBC: 7.2 10*3/uL (ref 4.0–10.5)
nRBC: 0 % (ref 0.0–0.2)

## 2019-06-18 LAB — COMPREHENSIVE METABOLIC PANEL
ALT: 2635 U/L — ABNORMAL HIGH (ref 0–44)
AST: 1613 U/L — ABNORMAL HIGH (ref 15–41)
Albumin: 3.2 g/dL — ABNORMAL LOW (ref 3.5–5.0)
Alkaline Phosphatase: 176 U/L — ABNORMAL HIGH (ref 38–126)
Anion gap: 7 (ref 5–15)
BUN: 9 mg/dL (ref 6–20)
CO2: 22 mmol/L (ref 22–32)
Calcium: 9 mg/dL (ref 8.9–10.3)
Chloride: 104 mmol/L (ref 98–111)
Creatinine, Ser: 0.56 mg/dL — ABNORMAL LOW (ref 0.61–1.24)
GFR calc Af Amer: >60 mL/min (ref >60)
GFR calc non Af Amer: >60 mL/min (ref >60)
Glucose, Bld: 102 mg/dL — ABNORMAL HIGH (ref 70–99)
Potassium: 4.4 mmol/L (ref 3.5–5.1)
Sodium: 133 mmol/L — ABNORMAL LOW (ref 135–145)
Total Bilirubin: 10.4 mg/dL — ABNORMAL HIGH (ref 0.3–1.2)
Total Protein: 6.4 g/dL — ABNORMAL LOW (ref 6.5–8.1)

## 2019-06-18 LAB — IMMUNOGLOBULINS A/E/G/M, SERUM
IgA: 309 mg/dL (ref 90–386)
IgE (Immunoglobulin E), Serum: 74 IU/mL (ref 6–495)
IgG (Immunoglobin G), Serum: 894 mg/dL (ref 603–1613)
IgM (Immunoglobulin M), Srm: 110 mg/dL (ref 20–172)

## 2019-06-18 LAB — HEPATITIS B E ANTIGEN: Hep B E Ag: POSITIVE — AB

## 2019-06-18 LAB — PROTIME-INR
INR: 1 (ref 0.8–1.2)
Prothrombin Time: 12.8 seconds (ref 11.4–15.2)

## 2019-06-18 LAB — HEPATITIS B SURFACE ANTIBODY, QUANTITATIVE: Hep B S AB Quant (Post): <3.1 m[IU]/mL — ABNORMAL LOW (ref >9.9)

## 2019-06-18 LAB — HEPATITIS B E ANTIBODY: Hep B E Ab: NEGATIVE

## 2019-06-18 NOTE — Progress Notes (Signed)
[facility]  PROGRESS NOTE  Antonio Chapman FVC:[id number] DOB: 11/01/1975 DOA: 06/15/2019 PCP: Antonio Grandchild, MD  Brief History    Antonio Chapman is a 43 y.o. year old male with no significant medical history who presented on 06/15/2019 after noticing yellow discoloration of his skin 2days prior to admission, progressive fatigue, abdominal pain and came to ED for further evaluation and was found to have transaminitis and jaundice secondary to acute hepatitis B.  During hospital course transaminitis was worked up results were negative for HIV, acetaminophen level, lipase normal, mild gallbladder wall thickening with adjacent pericholecystic fluid with some concern for acute cholecystitis on CT abdomen imaging, right upper quadrant ultrasound showed trace pericholecystic fluid and negative sonographic Murphy sign.    A & P     Acute hepatitis B (surface antigen and core IgM positive).  No known risk factors HIV antibody negative, hep B E antigen positive, hep B antibody pending, hep B DNA pending, ID consulted no need for antiviral therapy given no signs of acute liver failure (total bilirubin less than 3, INR less than 1.5, no encephalopathy, no ascites), continue supportive care and close monitoring of liver function   Jaundice with Transaminitis and bilirubinemia, stable.  Secondary to acute hepatitis B. no signs of liver failure.  We will continue to closely monitor LFT markers      DVT prophylaxis: Lovenox Code Status: Full code Family Communication: No family at bedside Disposition Plan: Close monitoring of LFTs, ensure no progression to liver failure anticipate discharge next 24 hours with close outpatient follow-up if INR stable in a.m. and no clinical evidence of liver decompensation     [facility] Direct contact: see [email or web address] (further directions at bottom of note if needed) 7PM-7AM contact night coverage as at bottom of note 06/18/2019, 8:27 AM  LOS: 2 days    Consultants  . GI, ID  Procedures  . None  Antibiotics  . None  Interval History/Subjective  Feels well, anxious to determine when he is able to go home  Objective   Vitals:  Vitals:   06/17/19 2225 06/18/19 0644  BP: 121/87 115/82  Pulse: 80 72  Resp: 17 17  Temp: 98.7 F (37.1 C) 98.4 F (36.9 C)  SpO2: 96% 98%    Exam:  Awake Alert, Oriented X 3, No new F.N deficits, Normal affect  [place].AT,PERRAL Supple Neck,No JVD, No cervical lymphadenopathy appriciated.  Symmetrical Chest wall movement, Good air movement bilaterally, CTAB RRR,No Gallops,Rubs or new Murmurs,  +ve B.Sounds, Abd Soft, No tenderness,  No rebound - guarding or rigidity. Jaundice skin   I have personally reviewed the following:   Data Reviewed: Basic Metabolic Panel: Recent Labs  Lab 06/15/19 2040 06/16/19 0647 06/17/19 0614 06/18/19 0558  NA 135 136 137 133*  K 3.5 4.2 4.4 4.4  CL 99 103 104 104  CO2 25 26 23 22   GLUCOSE 116* 94 91 102*  BUN 11 10 10 9   CREATININE 0.70 0.68 0.53* 0.56*  CALCIUM 9.1 9.2 9.1 9.0   Liver Function Tests: Recent Labs  Lab 06/15/19 2040 06/16/19 0647 06/17/19 0614 06/18/19 0558  AST 1,289* 1,280* 1,598* 1,613*  ALT 2,257* 2,107* 2,289* 2,635*  ALKPHOS 186* 163* 145* 176*  BILITOT 10.9* 12.7* 12.3* 10.4*  PROT 6.8 7.0 6.2* 6.4*  ALBUMIN 3.7 3.6 3.3* 3.2*   Recent Labs  Lab 06/15/19 2040  LIPASE 41   No results for input(s): AMMONIA in the last 168 hours. CBC: Recent Labs  Lab 06/15/19  2040 06/18/19 0558  WBC 8.1 7.2  HGB 14.4 14.2  HCT 39.8 40.2  MCV 87.9 90.1  PLT 284 300   Cardiac Enzymes: Recent Labs  Lab 06/16/19 0814  CKTOTAL 61   BNP (last 3 results) No results for input(s): BNP in the last 8760 hours.  ProBNP (last 3 results) No results for input(s): PROBNP in the last 8760 hours.  CBG: No results for input(s): GLUCAP in the last 168 hours.  Recent Results (from the past 240 hour(s))  SARS Coronavirus 2 [facility]  order, Performed in [facility] hospital lab) Nasopharyngeal Nasopharyngeal Swab     Status: None   Collection Time: 06/15/19 11:14 PM   Specimen: Nasopharyngeal Swab  Result Value Ref Range Status   SARS Coronavirus 2 NEGATIVE NEGATIVE Final    Comment: (NOTE) If result is NEGATIVE SARS-CoV-2 target nucleic acids are NOT DETECTED. The SARS-CoV-2 RNA is generally detectable in upper and lower  respiratory specimens during the acute phase of infection. The lowest  concentration of SARS-CoV-2 viral copies this assay can detect is 250  copies / mL. A negative result does not preclude SARS-CoV-2 infection  and should not be used as the sole basis for treatment or other  patient management decisions.  A negative result may occur with  improper specimen collection / handling, submission of specimen other  than nasopharyngeal swab, presence of viral mutation(s) within the  areas targeted by this assay, and inadequate number of viral copies  (<250 copies / mL). A negative result must be combined with clinical  observations, patient history, and epidemiological information. If result is POSITIVE SARS-CoV-2 target nucleic acids are DETECTED. The SARS-CoV-2 RNA is generally detectable in upper and lower  respiratory specimens dur ing the acute phase of infection.  Positive  results are indicative of active infection with SARS-CoV-2.  Clinical  correlation with patient history and other diagnostic information is  necessary to determine patient infection status.  Positive results do  not rule out bacterial infection or co-infection with other viruses. If result is PRESUMPTIVE POSTIVE SARS-CoV-2 nucleic acids MAY BE PRESENT.   A presumptive positive result was obtained on the submitted specimen  and confirmed on repeat testing.  While 2019 novel coronavirus  (SARS-CoV-2) nucleic acids may be present in the submitted sample  additional confirmatory testing may be necessary for epidemiological  and  / or clinical management purposes  to differentiate between  SARS-CoV-2 and other Sarbecovirus currently known to infect humans.  If clinically indicated additional testing with an alternate test  methodology [phone]) is advised. The SARS-CoV-2 RNA is generally  detectable in upper and lower respiratory sp ecimens during the acute  phase of infection. The expected result is Negative. Fact Sheet for Patients:  StrictlyIdeas.no Fact Sheet for Healthcare Providers: BankingDealers.co.za This test is not yet approved or cleared by the [place] FDA and has been authorized for detection and/or diagnosis of SARS-CoV-2 by FDA under an Emergency Use Authorization (EUA).  This EUA will remain in effect (meaning this test can be used) for the duration of the COVID-19 declaration under Section 564(b)(1) of the Act, 21 U.S.C. section 360bbb-3(b)(1), unless the authorization is terminated or revoked sooner. Performed at [facility], [place] [address], [place], [place] [address]      Studies: Nm Hepato W/eject Fract  Result Date: 06/16/2019 CLINICAL DATA:  Painless jaundice for several days. EXAM: NUCLEAR MEDICINE HEPATOBILIARY IMAGING WITH GALLBLADDER EF TECHNIQUE: Sequential images of the abdomen were obtained out to 60  minutes following intravenous administration of radiopharmaceutical. After oral ingestion of Ensure, gallbladder ejection fraction was determined. At 60 min, normal ejection fraction is greater than 33%. RADIOPHARMACEUTICALS:  10.7 mCi Tc-3765m  Choletec IV COMPARISON:  None. FINDINGS: Prompt uptake and biliary excretion of activity by the liver is seen. Gallbladder activity is visualized, consistent with patency of cystic duct. Biliary activity passes into small bowel, consistent with patent common bile duct. Calculated gallbladder ejection fraction is 83%. (Normal gallbladder ejection fraction with Ensure is greater than  33%.) IMPRESSION: Normal hepatobiliary imaging with normal gallbladder function. Electronically Signed   By: Amie Portlandavid  Ormond M.D.   On: 06/16/2019 17:08    Scheduled Meds: . enoxaparin (LOVENOX) injection  40 mg Subcutaneous Q24H  . pantoprazole  40 mg Oral Daily  . sodium chloride flush  3 mL Intravenous Q12H   Continuous Infusions: . sodium chloride 125 mL/hr at 06/17/19 2054  . sodium chloride      Principal Problem:   Acute hepatitis B Active Problems:   Painless jaundice   Cigarette smoker      Laverna PeaceShayla D   [facility]

## 2019-06-18 NOTE — Progress Notes (Signed)
Patient ID: Antonio Chapman, male   DOB: Aug 13, 1976, 43 y.o.   MRN: [id number]    Progress Note   Subjective  Day # 3 CC; jaundice  Patient feels okay, no specific complaints of abdominal pain, no nausea or vomiting, has had some mild joint aches.  No fever.  INR 1.0 T bili 10.6/alk phos 176/AST 1613/ALT 2635  Hepatitis B E antigen positive Hepatitis B E antibody pending Hep B DNA quant pending Hepatitis D pending    Objective   Vital signs in last 24 hours: Temp:  [98.4 F (36.9 C)-98.7 F (37.1 C)] 98.4 F (36.9 C) (09/16 0644) Pulse Rate:  [72-80] 72 (09/16 0644) Resp:  [17] 17 (09/16 0644) BP: (105-121)/(67-87) 115/82 (09/16 0644) SpO2:  [96 %-98 %] 98 % (09/16 0644) Last BM Date: 06/15/19 General:    Well-developed white male in NAD, jaundiced, up and about in the room Heart:  Regular rate and rhythm; no murmurs Lungs: Respirations even and unlabored, lungs CTA bilaterally Abdomen:  Soft, nontender and nondistended. Normal bowel sounds. Extremities:  Without edema. Neurologic:  Alert and oriented,  grossly normal neurologically. Psych:  Cooperative. Normal mood and affect.  Intake/Output from previous day: 09/15 0701 - 09/16 0700 In: 960 [P.O.:960] Out: -  Intake/Output this shift: Total I/O In: 240 [P.O.:240] Out: -   Lab Results: Recent Labs    06/15/19 2040 06/18/19 0558  WBC 8.1 7.2  HGB 14.4 14.2  HCT 39.8 40.2  PLT 284 300   BMET Recent Labs    06/16/19 0647 06/17/19 0614 06/18/19 0558  NA 136 137 133*  K 4.2 4.4 4.4  CL 103 104 104  CO2 26 23 22   GLUCOSE 94 91 102*  BUN 10 10 9   CREATININE 0.68 0.53* 0.56*  CALCIUM 9.2 9.1 9.0   LFT Recent Labs    06/18/19 0558  PROT 6.4*  ALBUMIN 3.2*  AST 1,613*  ALT 2,635*  ALKPHOS 176*  BILITOT 10.4*   PT/INR Recent Labs    06/17/19 0607 06/18/19 0558  LABPROT 13.1 12.8  INR 1.0 1.0    Studies/Results: Nm Hepato W/eject Fract  Result Date: 06/16/2019 CLINICAL DATA:  Painless  jaundice for several days. EXAM: NUCLEAR MEDICINE HEPATOBILIARY IMAGING WITH GALLBLADDER EF TECHNIQUE: Sequential images of the abdomen were obtained out to 60 minutes following intravenous administration of radiopharmaceutical. After oral ingestion of Ensure, gallbladder ejection fraction was determined. At 60 min, normal ejection fraction is greater than 33%. RADIOPHARMACEUTICALS:  10.7 mCi Tc-8m Choletec IV COMPARISON:  None. FINDINGS: Prompt uptake and biliary excretion of activity by the liver is seen. Gallbladder activity is visualized, consistent with patency of cystic duct. Biliary activity passes into small bowel, consistent with patent common bile duct. Calculated gallbladder ejection fraction is 83%. (Normal gallbladder ejection fraction with Ensure is greater than 33%.) IMPRESSION: Normal hepatobiliary imaging with normal gallbladder function. Electronically Signed   By: DLajean ManesM.D.   On: 06/16/2019 17:08       Assessment / Plan:    #[age]year old white male with acute hepatitis B. He is hepatitis B E antigen positive. Bilirubin starting to trend down, transaminases still on the rise Hepatitis D pending Hep C RNA negative. Hepatitis B DNA quant pending  Fortunately INR remains normal.  No evidence for hepatic decompensation.  Plan; follow-up hepatic panel and INR in a.m. If INR perfectly stable, he should be able to be discharged tomorrow but will need close outpatient follow-up with Dr. MRush Landmark Would plan to  do labs outpatient on Monday, 06/23/2019. If patient has any evidence of decompensation would opt to start treatment at that point, if remains very stable will continue conservative management, observation and follow-up over the next few months and hope that he will seroconvert We discussed potential return to work.  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 Advised to stay out of work  over the next week. When returning to work advised avoiding food handling if possible, and/or double gloving, mask etc. No alcohol, no acetaminophen, no unprotected sex.    Principal Problem:   Acute hepatitis B Active Problems:   Painless jaundice   Cigarette smoker     LOS: 2 days     PA-C 06/18/2019, 11:58 AM

## 2019-06-18 NOTE — Progress Notes (Signed)
Patient ID: Antonio Chapman, male   DOB: 09/01/1976, 43 y.o.   MRN: [id number]         [facility]  Date of Admission:  06/15/2019     ASSESSMENT: His liver enzymes are up slightly today but he is bilirubin has come down a little bit and his INR remains normal.  The AASLD guidelines recommend:  Guidance Statements for Treatment of Patients With Acute Symptomatic Hepatitis B  1. Antiviral treatment is indicated for only those patients with acute hepatitis B who have acute liver failure or who have a protracted, severe course, as indicated by total bilirubin >3 mg/dL (or direct bilirubin >4.0>1.5 mg/dL), international normalized ratio > 1.5, encephalopathy, or ascites.  I am in agreement with continued supportive care and monitoring for the next few weeks before deciding on treatment.  He does have access to treatment with his current insurance.  PLAN: 1. Supportive care and continued monitoring of liver function  Principal Problem:   Acute hepatitis B Active Problems:   Painless jaundice   Cigarette smoker   Scheduled Meds: . enoxaparin (LOVENOX) injection  40 mg Subcutaneous Q24H  . pantoprazole  40 mg Oral Daily  . sodium chloride flush  3 mL Intravenous Q12H   Continuous Infusions: . sodium chloride 125 mL/hr at 06/17/19 2054  . sodium chloride     PRN Meds:.sodium chloride, Melatonin, sodium chloride flush   SUBJECTIVE: He is still having some indigestion and poor appetite but says that he may be feeling a little bit better today.  Review of Systems: Review of Systems  Constitutional: Negative for fever and weight loss.  Gastrointestinal: Positive for abdominal pain and heartburn. Negative for diarrhea, nausea and vomiting.  Skin: Negative for rash.    No Known Allergies  OBJECTIVE: Vitals:   06/17/19 1411 06/17/19 2225 06/18/19 0644 06/18/19 1510  BP: 105/67 121/87 115/82 122/78  Pulse: 75 80 72 72  Resp: 17 17 17 20   Temp: 98.5 F  (36.9 C) 98.7 F (37.1 C) 98.4 F (36.9 C) 98.4 F (36.9 C)  TempSrc: Oral Oral Oral Oral  SpO2: 97% 96% 98% 99%  Weight:      Height:       Body mass index is 26.76 kg/m.  Physical Exam Constitutional:      Comments: He appears well other than his jaundice.  He is in good spirits.  Cardiovascular:     Rate and Rhythm: Normal rate and regular rhythm.     Heart sounds: No murmur.  Pulmonary:     Effort: Pulmonary effort is normal.     Breath sounds: Normal breath sounds.  Abdominal:     Palpations: Abdomen is soft. There is no mass.     Tenderness: There is no abdominal tenderness.     Lab Results Lab Results  Component Value Date   WBC 7.2 06/18/2019   HGB 14.2 06/18/2019   HCT 40.2 06/18/2019   MCV 90.1 06/18/2019   PLT 300 06/18/2019    Lab Results  Component Value Date   CREATININE 0.56 (L) 06/18/2019   BUN 9 06/18/2019   NA 133 (L) 06/18/2019   K 4.4 06/18/2019   CL 104 06/18/2019   CO2 22 06/18/2019    Lab Results  Component Value Date   ALT 2,635 (H) 06/18/2019   AST 1,613 (H) 06/18/2019   ALKPHOS 176 (H) 06/18/2019   BILITOT 10.4 (H) 06/18/2019     Microbiology: Recent Results (from the past 240 hour(s))  SARS Coronavirus 2 [facility] order, Performed in [facility] hospital lab) Nasopharyngeal Nasopharyngeal Swab     Status: None   Collection Time: 06/15/19 11:14 PM   Specimen: Nasopharyngeal Swab  Result Value Ref Range Status   SARS Coronavirus 2 NEGATIVE NEGATIVE Final    Comment: (NOTE) If result is NEGATIVE SARS-CoV-2 target nucleic acids are NOT DETECTED. The SARS-CoV-2 RNA is generally detectable in upper and lower  respiratory specimens during the acute phase of infection. The lowest  concentration of SARS-CoV-2 viral copies this assay can detect is 250  copies / mL. A negative result does not preclude SARS-CoV-2 infection  and should not be used as the sole basis for treatment or other  patient management decisions.  A negative  result may occur with  improper specimen collection / handling, submission of specimen other  than nasopharyngeal swab, presence of viral mutation(s) within the  areas targeted by this assay, and inadequate number of viral copies  (<250 copies / mL). A negative result must be combined with clinical  observations, patient history, and epidemiological information. If result is POSITIVE SARS-CoV-2 target nucleic acids are DETECTED. The SARS-CoV-2 RNA is generally detectable in upper and lower  respiratory specimens dur ing the acute phase of infection.  Positive  results are indicative of active infection with SARS-CoV-2.  Clinical  correlation with patient history and other diagnostic information is  necessary to determine patient infection status.  Positive results do  not rule out bacterial infection or co-infection with other viruses. If result is PRESUMPTIVE POSTIVE SARS-CoV-2 nucleic acids MAY BE PRESENT.   A presumptive positive result was obtained on the submitted specimen  and confirmed on repeat testing.  While 2019 novel coronavirus  (SARS-CoV-2) nucleic acids may be present in the submitted sample  additional confirmatory testing may be necessary for epidemiological  and / or clinical management purposes  to differentiate between  SARS-CoV-2 and other Sarbecovirus currently known to infect humans.  If clinically indicated additional testing with an alternate test  methodology [phone]) is advised. The SARS-CoV-2 RNA is generally  detectable in upper and lower respiratory sp ecimens during the acute  phase of infection. The expected result is Negative. Fact Sheet for Patients:  StrictlyIdeas.no Fact Sheet for Healthcare Providers: BankingDealers.co.za This test is not yet approved or cleared by the [place] FDA and has been authorized for detection and/or diagnosis of SARS-CoV-2 by FDA under an Emergency Use Authorization  (EUA).  This EUA will remain in effect (meaning this test can be used) for the duration of the COVID-19 declaration under Section 564(b)(1) of the Act, 21 U.S.C. section 360bbb-3(b)(1), unless the authorization is terminated or revoked sooner. Performed at [facility], [place] [address], [place], [place] [address]     Michel Bickers, [facility] [phone] pager   [phone] cell 06/18/2019, 3:56 PM

## 2019-06-19 ENCOUNTER — Other Ambulatory Visit: Payer: Self-pay

## 2019-06-19 DIAGNOSIS — R7989 Other specified abnormal findings of blood chemistry: Secondary | ICD-10-CM

## 2019-06-19 DIAGNOSIS — B169 Acute hepatitis B without delta-agent and without hepatic coma: Secondary | ICD-10-CM

## 2019-06-19 LAB — COMPREHENSIVE METABOLIC PANEL
ALT: 2565 U/L — ABNORMAL HIGH (ref 0–44)
AST: 1497 U/L — ABNORMAL HIGH (ref 15–41)
Albumin: 3.3 g/dL — ABNORMAL LOW (ref 3.5–5.0)
Alkaline Phosphatase: 166 U/L — ABNORMAL HIGH (ref 38–126)
Anion gap: 10 (ref 5–15)
BUN: 7 mg/dL (ref 6–20)
CO2: 22 mmol/L (ref 22–32)
Calcium: 8.8 mg/dL — ABNORMAL LOW (ref 8.9–10.3)
Chloride: 105 mmol/L (ref 98–111)
Creatinine, Ser: 0.59 mg/dL — ABNORMAL LOW (ref 0.61–1.24)
GFR calc Af Amer: >60 mL/min (ref >60)
GFR calc non Af Amer: >60 mL/min (ref >60)
Glucose, Bld: 90 mg/dL (ref 70–99)
Potassium: 4 mmol/L (ref 3.5–5.1)
Sodium: 137 mmol/L (ref 135–145)
Total Bilirubin: 9.2 mg/dL — ABNORMAL HIGH (ref 0.3–1.2)
Total Protein: 6.2 g/dL — ABNORMAL LOW (ref 6.5–8.1)

## 2019-06-19 LAB — PROTIME-INR
INR: 1 (ref 0.8–1.2)
Prothrombin Time: 13.1 seconds (ref 11.4–15.2)

## 2019-06-19 LAB — HEPATITIS B DNA, ULTRAQUANTITATIVE, PCR
HBV DNA SERPL PCR-ACNC: 4820000 IU/mL
HBV DNA SERPL PCR-LOG IU: 6.683 log10 IU/mL

## 2019-06-19 NOTE — Progress Notes (Signed)
Patient ID: Antonio Chapman, male   DOB: 16-Mar-1976, 43 y.o.   MRN: [id number]         [facility]  Date of Admission:  06/15/2019     ASSESSMENT: His liver enzymes and bilirubin have all decreased slightly and his INR remained normal.  PLAN: 1. Supportive care and continued monitoring of liver function 2. I will arrange follow-up in my clinic  Principal Problem:   Acute hepatitis B Active Problems:   Painless jaundice   Cigarette smoker   Transaminitis   Hyperbilirubinemia   Scheduled Meds: . enoxaparin (LOVENOX) injection  40 mg Subcutaneous Q24H  . pantoprazole  40 mg Oral Daily  . sodium chloride flush  3 mL Intravenous Q12H   Continuous Infusions: . sodium chloride 125 mL/hr at 06/18/19 1840  . sodium chloride     PRN Meds:.sodium chloride, Melatonin, sodium chloride flush   SUBJECTIVE: His appetite has improved.  Review of Systems: Review of Systems  Constitutional: Negative for fever and weight loss.  Gastrointestinal: Positive for heartburn. Negative for abdominal pain, diarrhea, nausea and vomiting.  Skin: Negative for rash.    No Known Allergies  OBJECTIVE: Vitals:   06/18/19 0644 06/18/19 1510 06/18/19 2226 06/19/19 0640  BP: 115/82 122/78 110/79 116/80  Pulse: 72 72 83 69  Resp: 17 20 17 17   Temp: 98.4 F (36.9 C) 98.4 F (36.9 C) 98.1 F (36.7 C) 98.4 F (36.9 C)  TempSrc: Oral Oral Oral Oral  SpO2: 98% 99% 98% 97%  Weight:      Height:       Body mass index is 26.76 kg/m.  Physical Exam Constitutional:      Comments: He appears well other than his jaundice.    Cardiovascular:     Rate and Rhythm: Normal rate and regular rhythm.     Heart sounds: No murmur.  Pulmonary:     Effort: Pulmonary effort is normal.     Breath sounds: Normal breath sounds.  Abdominal:     Palpations: Abdomen is soft. There is no mass.     Tenderness: There is no abdominal tenderness.     Lab Results Lab Results  Component  Value Date   WBC 7.2 06/18/2019   HGB 14.2 06/18/2019   HCT 40.2 06/18/2019   MCV 90.1 06/18/2019   PLT 300 06/18/2019    Lab Results  Component Value Date   CREATININE 0.59 (L) 06/19/2019   BUN 7 06/19/2019   NA 137 06/19/2019   K 4.0 06/19/2019   CL 105 06/19/2019   CO2 22 06/19/2019    Lab Results  Component Value Date   ALT 2,565 (H) 06/19/2019   AST 1,497 (H) 06/19/2019   ALKPHOS 166 (H) 06/19/2019   BILITOT 9.2 (H) 06/19/2019     Microbiology: Recent Results (from the past 240 hour(s))  SARS Coronavirus 2 [facility] order, Performed in [facility] hospital lab) Nasopharyngeal Nasopharyngeal Swab     Status: None   Collection Time: 06/15/19 11:14 PM   Specimen: Nasopharyngeal Swab  Result Value Ref Range Status   SARS Coronavirus 2 NEGATIVE NEGATIVE Final    Comment: (NOTE) If result is NEGATIVE SARS-CoV-2 target nucleic acids are NOT DETECTED. The SARS-CoV-2 RNA is generally detectable in upper and lower  respiratory specimens during the acute phase of infection. The lowest  concentration of SARS-CoV-2 viral copies this assay can detect is 250  copies / mL. A negative result does not preclude SARS-CoV-2 infection  and should  not be used as the sole basis for treatment or other  patient management decisions.  A negative result may occur with  improper specimen collection / handling, submission of specimen other  than nasopharyngeal swab, presence of viral mutation(s) within the  areas targeted by this assay, and inadequate number of viral copies  (<250 copies / mL). A negative result must be combined with clinical  observations, patient history, and epidemiological information. If result is POSITIVE SARS-CoV-2 target nucleic acids are DETECTED. The SARS-CoV-2 RNA is generally detectable in upper and lower  respiratory specimens dur ing the acute phase of infection.  Positive  results are indicative of active infection with SARS-CoV-2.  Clinical  correlation with  patient history and other diagnostic information is  necessary to determine patient infection status.  Positive results do  not rule out bacterial infection or co-infection with other viruses. If result is PRESUMPTIVE POSTIVE SARS-CoV-2 nucleic acids MAY BE PRESENT.   A presumptive positive result was obtained on the submitted specimen  and confirmed on repeat testing.  While 2019 novel coronavirus  (SARS-CoV-2) nucleic acids may be present in the submitted sample  additional confirmatory testing may be necessary for epidemiological  and / or clinical management purposes  to differentiate between  SARS-CoV-2 and other Sarbecovirus currently known to infect humans.  If clinically indicated additional testing with an alternate test  methodology [phone]([id number]) is advised. The SARS-CoV-2 RNA is generally  detectable in upper and lower respiratory sp ecimens during the acute  phase of infection. The expected result is Negative. Fact Sheet for Patients:  BoilerBrush.com.cyhttps://www.fda.gov/media/136312/download Fact Sheet for Healthcare Providers: https://pope.com/https://www.fda.gov/media/136313/download This test is not yet approved or cleared by the [place] FDA and has been authorized for detection and/or diagnosis of SARS-CoV-2 by FDA under an Emergency Use Authorization (EUA).  This EUA will remain in effect (meaning this test can be used) for the duration of the COVID-19 declaration under Section 564(b)(1) of the Act, 21 U.S.C. section 360bbb-3(b)(1), unless the authorization is terminated or revoked sooner. Performed at [facility], [address] [place], [place] [address]     Cliffton AstersJohn Troyce Febo, MD [facility] [phone] pager   [phone] cell 06/19/2019, 9:15 AM

## 2019-06-19 NOTE — Discharge Summary (Signed)
Antonio Chapman WER:[id number] DOB: [birth year] DOA: 06/15/2019  PCP: Janith Lima, MD  Admit date: 06/15/2019 Discharge date: 06/19/2019  Admitted From: Home Disposition: Home  Recommendations for Outpatient Follow-up:  1. Follow up with PCP in 1-2 weeks 2. Follow-up arranged with GI/ID 3. Follow-up CMP on 9/21, arranged with GI    Home Health: No  Equipment/Devices: None  Discharge Condition: Stable CODE STATUS: Full Diet recommendation: Heart Healthy    Brief/Interim Summary: History of present illness:  Antonio Chapman is a 43 y.o. year old male with no significant medical history who presented on 06/15/2019 after noticing yellow discoloration of his skin 2days prior to admission, progressive fatigue, abdominal pain and came to ED for further evaluation and was found to have transaminitis and jaundice secondary to acute hepatitis B.  During hospital course transaminitis was worked up results were negative for HIV, acetaminophen level, lipase normal, mild gallbladder wall thickening with adjacent pericholecystic fluid with some concern for acute cholecystitis on CT abdomen imaging, right upper quadrant ultrasound showed trace pericholecystic fluid and negative sonographic Murphy sign.  Remaining hospital course addressed in problem based format below:   Hospital Course:    Acute hepatitis B (surface antigen and core IgM positive).  No known risk factors: HIV antibody negative, hep B E antigen positive, hep B antibody pending, hep B DNA pending, ID consulted no need for antiviral therapy given no signs of acute liver failure (total bilirubin less than 3, INR less than 1.5, no encephalopathy, no ascites), continue supportive care and close monitoring of liver function as outpatient with close follow-up labs arranged by GI for 9/21.  ID will also follow-up in clinic.  Advised to stay out of work over the next week. When returning to work advised avoiding food handling if possible, and/or  double gloving, mask etc.No alcohol, no acetaminophen, no unprotected sex.   Painless jaundice with Transaminitis and bilirubinemia, stable.  Secondary to acute hepatitis B. no signs of liver failure.    On discharge LFTs and INR remained stable (respectively AST 1497, ALT 2565, INR 1)   Consultations:  GI, ID  Procedures/Studies: None Subjective: Feels well.  Ate breakfast okay.  No nausea.  Discharge Exam: Vitals:   06/18/19 2226 06/19/19 0640  BP: 110/79 116/80  Pulse: 83 69  Resp: 17 17  Temp: 98.1 F (36.7 C) 98.4 F (36.9 C)  SpO2: 98% 97%   Vitals:   06/18/19 0644 06/18/19 1510 06/18/19 2226 06/19/19 0640  BP: 115/82 122/78 110/79 116/80  Pulse: 72 72 83 69  Resp: 17 20 17 17   Temp: 98.4 F (36.9 C) 98.4 F (36.9 C) 98.1 F (36.7 C) 98.4 F (36.9 C)  TempSrc: Oral Oral Oral Oral  SpO2: 98% 99% 98% 97%  Weight:      Height:        Awake Alert, Oriented X 3, No new F.N deficits, Normal affect  [place].AT,PERRAL Symmetrical Chest wall movement, Good air movement bilaterally, CTAB RRR,No Gallops,Rubs or new Murmurs,  +ve B.Sounds, Abd Soft, No tenderness,  No rebound - guarding or rigidity. Jaundice skin  Discharge Diagnoses:  Principal Problem:   Acute hepatitis B Active Problems:   Painless jaundice   Cigarette smoker   Transaminitis   Hyperbilirubinemia    Discharge Instructions  Discharge Instructions    Diet - low sodium heart healthy   Complete by: As directed    Increase activity slowly   Complete by: As directed      Allergies as of 06/19/2019  No Known Allergies     Medication List    STOP taking these medications   acetaminophen 500 MG tablet Commonly known as: TYLENOL     TAKE these medications   Melatonin 10 MG Caps Take 10 mg by mouth at bedtime as needed (sleep).   naproxen sodium 220 MG tablet Commonly known as: ALEVE Take 440 mg by mouth 2 (two) times daily as needed (pain).   omeprazole 20 MG capsule Commonly  known as: PRILOSEC Take 20 mg by mouth daily.       No Known Allergies      The results of significant diagnostics from this hospitalization (including imaging, microbiology, ancillary and laboratory) are listed below for reference.     Microbiology: Recent Results (from the past 240 hour(s))  SARS Coronavirus 2 [facility] order, Performed in [facility] hospital lab) Nasopharyngeal Nasopharyngeal Swab     Status: None   Collection Time: 06/15/19 11:14 PM   Specimen: Nasopharyngeal Swab  Result Value Ref Range Status   SARS Coronavirus 2 NEGATIVE NEGATIVE Final    Comment: (NOTE) If result is NEGATIVE SARS-CoV-2 target nucleic acids are NOT DETECTED. The SARS-CoV-2 RNA is generally detectable in upper and lower  respiratory specimens during the acute phase of infection. The lowest  concentration of SARS-CoV-2 viral copies this assay can detect is 250  copies / mL. A negative result does not preclude SARS-CoV-2 infection  and should not be used as the sole basis for treatment or other  patient management decisions.  A negative result may occur with  improper specimen collection / handling, submission of specimen other  than nasopharyngeal swab, presence of viral mutation(s) within the  areas targeted by this assay, and inadequate number of viral copies  (<250 copies / mL). A negative result must be combined with clinical  observations, patient history, and epidemiological information. If result is POSITIVE SARS-CoV-2 target nucleic acids are DETECTED. The SARS-CoV-2 RNA is generally detectable in upper and lower  respiratory specimens dur ing the acute phase of infection.  Positive  results are indicative of active infection with SARS-CoV-2.  Clinical  correlation with patient history and other diagnostic information is  necessary to determine patient infection status.  Positive results do  not rule out bacterial infection or co-infection with other viruses. If result is  PRESUMPTIVE POSTIVE SARS-CoV-2 nucleic acids MAY BE PRESENT.   A presumptive positive result was obtained on the submitted specimen  and confirmed on repeat testing.  While 2019 novel coronavirus  (SARS-CoV-2) nucleic acids may be present in the submitted sample  additional confirmatory testing may be necessary for epidemiological  and / or clinical management purposes  to differentiate between  SARS-CoV-2 and other Sarbecovirus currently known to infect humans.  If clinically indicated additional testing with an alternate test  methodology [phone]) is advised. The SARS-CoV-2 RNA is generally  detectable in upper and lower respiratory sp ecimens during the acute  phase of infection. The expected result is Negative. Fact Sheet for Patients:  BoilerBrush.com.cy Fact Sheet for Healthcare Providers: https://pope.com/ This test is not yet approved or cleared by the [place] FDA and has been authorized for detection and/or diagnosis of SARS-CoV-2 by FDA under an Emergency Use Authorization (EUA).  This EUA will remain in effect (meaning this test can be used) for the duration of the COVID-19 declaration under Section 564(b)(1) of the Act, 21 U.S.C. section 360bbb-3(b)(1), unless the authorization is terminated or revoked sooner. Performed at [facility], [address] [place], [place] [address]      Labs: BNP (last 3 results) No results for input(s): BNP in the last 8760 hours. Basic Metabolic Panel: Recent Labs  Lab 06/15/19 2040 06/16/19 0647 06/17/19 0614 06/18/19 0558 06/19/19 0521  NA 135 136 137 133* 137  K 3.5 4.2 4.4 4.4 4.0  CL 99 103 104 104 105  CO2 25 26 23 22 22   GLUCOSE 116* 94 91 102* 90  BUN 11 10 10 9 7   CREATININE 0.70 0.68 0.53* 0.56* 0.59*  CALCIUM 9.1 9.2 9.1 9.0 8.8*   Liver Function Tests: Recent Labs  Lab 06/15/19 2040 06/16/19 0647 06/17/19 0614 06/18/19 0558  06/19/19 0521  AST 1,289* 1,280* 1,598* 1,613* 1,497*  ALT 2,257* 2,107* 2,289* 2,635* 2,565*  ALKPHOS 186* 163* 145* 176* 166*  BILITOT 10.9* 12.7* 12.3* 10.4* 9.2*  PROT 6.8 7.0 6.2* 6.4* 6.2*  ALBUMIN 3.7 3.6 3.3* 3.2* 3.3*   Recent Labs  Lab 06/15/19 2040  LIPASE 41   No results for input(s): AMMONIA in the last 168 hours. CBC: Recent Labs  Lab 06/15/19 2040 06/18/19 0558  WBC 8.1 7.2  HGB 14.4 14.2  HCT 39.8 40.2  MCV 87.9 90.1  PLT 284 300   Cardiac Enzymes: Recent Labs  Lab 06/16/19 0814  CKTOTAL 61   BNP: Invalid input(s): POCBNP CBG: No results for input(s): GLUCAP in the last 168 hours. D-Dimer No results for input(s): DDIMER in the last 72 hours. Hgb A1c No results for input(s): HGBA1C in the last 72 hours. Lipid Profile No results for input(s): CHOL, HDL, LDLCALC, TRIG, CHOLHDL, LDLDIRECT in the last 72 hours. Thyroid function studies No results for input(s): TSH, T4TOTAL, T3FREE, THYROIDAB in the last 72 hours.  Invalid input(s): FREET3 Anemia work up No results for input(s): VITAMINB12, FOLATE, FERRITIN, TIBC, IRON, RETICCTPCT in the last 72 hours. Urinalysis    Component Value Date/Time   COLORURINE AMBER (A) 06/15/2019 2040   APPEARANCEUR CLEAR 06/15/2019 2040   LABSPEC 1.008 06/15/2019 2040   PHURINE 6.0 06/15/2019 2040   GLUCOSEU NEGATIVE 06/15/2019 2040   HGBUR NEGATIVE 06/15/2019 2040   BILIRUBINUR NEGATIVE 06/15/2019 2040   KETONESUR NEGATIVE 06/15/2019 2040   PROTEINUR NEGATIVE 06/15/2019 2040   NITRITE NEGATIVE 06/15/2019 2040   LEUKOCYTESUR NEGATIVE 06/15/2019 2040   Sepsis Labs Invalid input(s): PROCALCITONIN,  WBC,  LACTICIDVEN Microbiology Recent Results (from the past 240 hour(s))  SARS Coronavirus 2 [facility](Hospital order, Performed in [facility] hospital lab) Nasopharyngeal Nasopharyngeal Swab     Status: None   Collection Time: 06/15/19 11:14 PM   Specimen: Nasopharyngeal Swab  Result Value Ref Range Status   SARS  Coronavirus 2 NEGATIVE NEGATIVE Final    Comment: (NOTE) If result is NEGATIVE SARS-CoV-2 target nucleic acids are NOT DETECTED. The SARS-CoV-2 RNA is generally detectable in upper and lower  respiratory specimens during the acute phase of infection. The lowest  concentration of SARS-CoV-2 viral copies this assay can detect is 250  copies / mL. A negative result does not preclude SARS-CoV-2 infection  and should not be used as the sole basis for treatment or other  patient management decisions.  A negative result may occur with  improper specimen collection / handling, submission of specimen other  than nasopharyngeal swab, presence of viral mutation(s) within the  areas targeted by this assay, and inadequate number of viral copies  (<250 copies / mL). A negative result must be combined with clinical  observations, patient history, and epidemiological information. If  result is POSITIVE SARS-CoV-2 target nucleic acids are DETECTED. The SARS-CoV-2 RNA is generally detectable in upper and lower  respiratory specimens dur ing the acute phase of infection.  Positive  results are indicative of active infection with SARS-CoV-2.  Clinical  correlation with patient history and other diagnostic information is  necessary to determine patient infection status.  Positive results do  not rule out bacterial infection or co-infection with other viruses. If result is PRESUMPTIVE POSTIVE SARS-CoV-2 nucleic acids MAY BE PRESENT.   A presumptive positive result was obtained on the submitted specimen  and confirmed on repeat testing.  While 2019 novel coronavirus  (SARS-CoV-2) nucleic acids may be present in the submitted sample  additional confirmatory testing may be necessary for epidemiological  and / or clinical management purposes  to differentiate between  SARS-CoV-2 and other Sarbecovirus currently known to infect humans.  If clinically indicated additional testing with an alternate test   methodology [phone]) is advised. The SARS-CoV-2 RNA is generally  detectable in upper and lower respiratory sp ecimens during the acute  phase of infection. The expected result is Negative. Fact Sheet for Patients:  BoilerBrush.com.cyhttps://www.fda.gov/media/136312/download Fact Sheet for Healthcare Providers: https://pope.com/https://www.fda.gov/media/136313/download This test is not yet approved or cleared by the [place] FDA and has been authorized for detection and/or diagnosis of SARS-CoV-2 by FDA under an Emergency Use Authorization (EUA).  This EUA will remain in effect (meaning this test can be used) for the duration of the COVID-19 declaration under Section 564(b)(1) of the Act, 21 U.S.C. section 360bbb-3(b)(1), unless the authorization is terminated or revoked sooner. Performed at [facility], [address] [place], [place] [address]      Time coordinating discharge: Over 30 minutes  SIGNED:   Laverna PeaceShayla D Ennis Heavner, MD  [facility] 06/19/2019, 2:14 PM Pager   If 7PM-7AM, please contact night-coverage [email or web address] Password [email or web address]

## 2019-06-19 NOTE — Discharge Instructions (Signed)
Please come to the lab in the basement level of our building at [place] on 9/21 at any time during the day between 8AM and 5PM to have labs drawn.  You do not need an appt.  These results will go to Dr. Rush Landmark.  Stay out of work over the next week. When returning to work advised avoiding food handling if possible, and/or double gloving, mask etc. No alcohol, no acetaminophen, no unprotected sex.

## 2019-06-23 ENCOUNTER — Other Ambulatory Visit: Payer: Self-pay

## 2019-06-23 ENCOUNTER — Other Ambulatory Visit (INDEPENDENT_AMBULATORY_CARE_PROVIDER_SITE_OTHER): Payer: 59

## 2019-06-23 DIAGNOSIS — R7989 Other specified abnormal findings of blood chemistry: Secondary | ICD-10-CM

## 2019-06-23 DIAGNOSIS — B169 Acute hepatitis B without delta-agent and without hepatic coma: Secondary | ICD-10-CM

## 2019-06-23 DIAGNOSIS — R945 Abnormal results of liver function studies: Secondary | ICD-10-CM

## 2019-06-23 LAB — COMPREHENSIVE METABOLIC PANEL
ALT: 2299 U/L — ABNORMAL HIGH (ref 0–53)
AST: 1130 U/L — ABNORMAL HIGH (ref 0–37)
Albumin: 4.2 g/dL (ref 3.5–5.2)
Alkaline Phosphatase: 180 U/L — ABNORMAL HIGH (ref 39–117)
BUN: 12 mg/dL (ref 6–23)
CO2: 28 mEq/L (ref 19–32)
Calcium: 10.1 mg/dL (ref 8.4–10.5)
Chloride: 100 mEq/L (ref 96–112)
Creatinine, Ser: 0.83 mg/dL (ref 0.40–1.50)
GFR: 100.63 mL/min (ref >60.00)
Glucose, Bld: 103 mg/dL — ABNORMAL HIGH (ref 70–99)
Potassium: 4.5 mEq/L (ref 3.5–5.1)
Sodium: 137 mEq/L (ref 135–145)
Total Bilirubin: 8 mg/dL — ABNORMAL HIGH (ref 0.2–1.2)
Total Protein: 7.2 g/dL (ref 6.0–8.3)

## 2019-06-23 LAB — PROTIME-INR
INR: 1.1 ratio — ABNORMAL HIGH (ref 0.8–1.0)
Prothrombin Time: 12.9 s (ref 9.6–13.1)

## 2019-06-23 MED ORDER — PHYTONADIONE 5 MG PO TABS: 5.0000 mg | ORAL_TABLET | Freq: Every day | ORAL | 0 refills | Status: AC

## 2019-06-26 ENCOUNTER — Other Ambulatory Visit (INDEPENDENT_AMBULATORY_CARE_PROVIDER_SITE_OTHER): Payer: 59

## 2019-06-26 DIAGNOSIS — R945 Abnormal results of liver function studies: Secondary | ICD-10-CM

## 2019-06-26 DIAGNOSIS — R7989 Other specified abnormal findings of blood chemistry: Secondary | ICD-10-CM

## 2019-06-26 LAB — HEPATIC FUNCTION PANEL
ALT: 1525 U/L — ABNORMAL HIGH (ref 0–53)
AST: 601 U/L — ABNORMAL HIGH (ref 0–37)
Albumin: 4.2 g/dL (ref 3.5–5.2)
Alkaline Phosphatase: 198 U/L — ABNORMAL HIGH (ref 39–117)
Bilirubin, Direct: 2.1 mg/dL — ABNORMAL HIGH (ref 0.0–0.3)
Total Bilirubin: 4.4 mg/dL — ABNORMAL HIGH (ref 0.2–1.2)
Total Protein: 7.1 g/dL (ref 6.0–8.3)

## 2019-06-26 LAB — PROTIME-INR
INR: 1 ratio (ref 0.8–1.0)
Prothrombin Time: 11.5 s (ref 9.6–13.1)

## 2019-06-27 ENCOUNTER — Encounter: Payer: Self-pay | Admitting: Internal Medicine

## 2019-06-27 ENCOUNTER — Telehealth: Payer: Self-pay | Admitting: *Deleted

## 2019-06-27 ENCOUNTER — Other Ambulatory Visit: Payer: Self-pay

## 2019-06-27 DIAGNOSIS — R7989 Other specified abnormal findings of blood chemistry: Secondary | ICD-10-CM

## 2019-06-27 NOTE — Telephone Encounter (Signed)
RN attempted to notify patient of his hospital follow up appointment at [facility], but call went voice mail which was full. RN sent [employer]. Landis Gandy, RN

## 2019-06-27 NOTE — Telephone Encounter (Signed)
Patient was able to screen shot letter, needs nothing further at this time.

## 2019-06-30 ENCOUNTER — Other Ambulatory Visit: Payer: Self-pay

## 2019-06-30 ENCOUNTER — Other Ambulatory Visit (INDEPENDENT_AMBULATORY_CARE_PROVIDER_SITE_OTHER): Payer: 59

## 2019-06-30 DIAGNOSIS — R7989 Other specified abnormal findings of blood chemistry: Secondary | ICD-10-CM

## 2019-06-30 DIAGNOSIS — R945 Abnormal results of liver function studies: Secondary | ICD-10-CM

## 2019-06-30 LAB — HEPATIC FUNCTION PANEL
ALT: 620 U/L — ABNORMAL HIGH (ref 0–53)
AST: 162 U/L — ABNORMAL HIGH (ref 0–37)
Albumin: 4.5 g/dL (ref 3.5–5.2)
Alkaline Phosphatase: 196 U/L — ABNORMAL HIGH (ref 39–117)
Bilirubin, Direct: 1.4 mg/dL — ABNORMAL HIGH (ref 0.0–0.3)
Total Bilirubin: 3.2 mg/dL — ABNORMAL HIGH (ref 0.2–1.2)
Total Protein: 7.6 g/dL (ref 6.0–8.3)

## 2019-06-30 LAB — PROTIME-INR
INR: 0.9 ratio (ref 0.8–1.0)
Prothrombin Time: 10.9 s (ref 9.6–13.1)

## 2019-07-02 LAB — MISC LABCORP TEST (SEND OUT): Labcorp test code: 9985

## 2019-07-03 ENCOUNTER — Other Ambulatory Visit (INDEPENDENT_AMBULATORY_CARE_PROVIDER_SITE_OTHER): Payer: 59

## 2019-07-03 DIAGNOSIS — R7989 Other specified abnormal findings of blood chemistry: Secondary | ICD-10-CM | POA: Diagnosis not present

## 2019-07-03 LAB — HEPATIC FUNCTION PANEL
ALT: 295 U/L — ABNORMAL HIGH (ref 0–53)
AST: 65 U/L — ABNORMAL HIGH (ref 0–37)
Albumin: 4.4 g/dL (ref 3.5–5.2)
Alkaline Phosphatase: 150 U/L — ABNORMAL HIGH (ref 39–117)
Bilirubin, Direct: 1.2 mg/dL — ABNORMAL HIGH (ref 0.0–0.3)
Total Bilirubin: 3 mg/dL — ABNORMAL HIGH (ref 0.2–1.2)
Total Protein: 7.6 g/dL (ref 6.0–8.3)

## 2019-07-03 LAB — MISC LABCORP TEST (SEND OUT): Labcorp test code: 9985

## 2019-07-03 LAB — PROTIME-INR
INR: 1 ratio (ref 0.8–1.0)
Prothrombin Time: 11.5 s (ref 9.6–13.1)

## 2019-07-04 ENCOUNTER — Other Ambulatory Visit: Payer: Self-pay

## 2019-07-04 DIAGNOSIS — R7989 Other specified abnormal findings of blood chemistry: Secondary | ICD-10-CM

## 2019-07-07 ENCOUNTER — Other Ambulatory Visit (INDEPENDENT_AMBULATORY_CARE_PROVIDER_SITE_OTHER): Payer: 59

## 2019-07-07 ENCOUNTER — Encounter: Payer: Self-pay | Admitting: *Deleted

## 2019-07-07 DIAGNOSIS — R7989 Other specified abnormal findings of blood chemistry: Secondary | ICD-10-CM | POA: Diagnosis not present

## 2019-07-07 LAB — HEPATIC FUNCTION PANEL
ALT: 103 U/L — ABNORMAL HIGH (ref 0–53)
AST: 25 U/L (ref 0–37)
Albumin: 4.1 g/dL (ref 3.5–5.2)
Alkaline Phosphatase: 149 U/L — ABNORMAL HIGH (ref 39–117)
Bilirubin, Direct: 1 mg/dL — ABNORMAL HIGH (ref 0.0–0.3)
Total Bilirubin: 2.1 mg/dL — ABNORMAL HIGH (ref 0.2–1.2)
Total Protein: 6.8 g/dL (ref 6.0–8.3)

## 2019-07-07 LAB — PROTIME-INR
INR: 1 ratio (ref 0.8–1.0)
Prothrombin Time: 11.2 s (ref 9.6–13.1)

## 2019-07-08 ENCOUNTER — Encounter: Payer: Self-pay | Admitting: Physician Assistant

## 2019-07-08 ENCOUNTER — Ambulatory Visit: Payer: Commercial Managed Care - PPO | Admitting: Physician Assistant

## 2019-07-08 VITALS — BP 100/70 | HR 86 | Temp 98.3°F | Ht 68.0 in | Wt 177.0 lb

## 2019-07-08 DIAGNOSIS — B169 Acute hepatitis B without delta-agent and without hepatic coma: Secondary | ICD-10-CM

## 2019-07-08 DIAGNOSIS — K921 Melena: Secondary | ICD-10-CM | POA: Diagnosis not present

## 2019-07-08 MED ORDER — PEG 3350-KCL-NA BICARB-NACL 420 G PO SOLR: 4000.0000 mL | Freq: Once | ORAL | 0 refills | Status: AC

## 2019-07-08 NOTE — Progress Notes (Signed)
Subjective:    Patient ID: Antonio Chapman, male    DOB: 1976-08-30, 43 y.o.   MRN: [id number]  HPI [name] is a 43 year old white male, recently established with Dr. Rush Landmark, when he was seen in consultation during recent hospitalization.  He was admitted 9/13 through 06/18/2019 with new onset of jaundice, epigastric discomfort and severe indigestion. He was diagnosed with acute hepatitis B. Upper abdominal ultrasound showed a normal-appearing liver. CT of the abdomen and pelvis showed gallbladder wall thickening up to 6 mm with adjacent pericholecystic and right upper quadrant fluid, HIDA scan was normal with 83% ejection fraction. Hepatitis B E antigen is positive, hepatitis B DNA PCR 4,827,000, hepatitis B surface antibody negative, hepatitis D antibody is still pending. On 06/18/2019 T bili was 10.4 alk phos 176, AST 1000 and ALT 2600  INR remained normal.  Patient was seen in consultation by infectious disease/Dr. Megan Salon.  Decision was made to observe him and not initiate treatment. Patient had follow-up labs done yesterday with INR 1.0, T bili 2.1/alk phos 149/AST 25/ALT 103. Patient has been out of work since discharge from the hospital.  He says he has had some fatigue she is improving, he had been having some joint aches which have resolved and all indigestion and epigastric discomfort has also resolved. He does mention that he has noticed some intermittent bright red blood with his bowel movements over the past month.  The blood does not appear to be mixed with the stool but has been noted on the tissue and in the water.  He tells me that his bowel movements have been regular and he has not had constipation or a lot of straining. No prior endoscopic evaluation. Family history is negative for colon cancer and polyps.  Review of Systems Pertinent positive and negative review of systems were noted in the above HPI section.  All other review of systems was otherwise negative.  Outpatient  Encounter Medications as of 07/08/2019  Medication Sig  . Melatonin 10 MG CAPS Take 10 mg by mouth at bedtime as needed (sleep).  . naproxen sodium (ALEVE) 220 MG tablet Take 440 mg by mouth 2 (two) times daily as needed (pain).  Marland Kitchen omeprazole (PRILOSEC) 20 MG capsule Take 20 mg by mouth daily.  . vitamin k 100 MCG tablet Take 100 mcg by mouth daily.  . polyethylene glycol-electrolytes (NULYTELY/GOLYTELY) 420 g solution Take 4,000 mLs by mouth once for 1 dose. For colonoscopy prep   No facility-administered encounter medications on file as of 07/08/2019.    No Known Allergies Patient Active Problem List   Diagnosis Date Noted  . Transaminitis 06/18/2019  . Hyperbilirubinemia 06/18/2019  . Acute hepatitis B 06/17/2019  . Cigarette smoker 06/17/2019  . Painless jaundice 06/16/2019   Social History   Socioeconomic History  . Marital status: Single    Spouse name: Not on file  . Number of children: Not on file  . Years of education: Not on file  . Highest education level: Not on file  Occupational History  . Not on file  Social Needs  . Financial resource strain: Not on file  . Food insecurity    Worry: Not on file    Inability: Not on file  . Transportation needs    Medical: Not on file    Non-medical: Not on file  Tobacco Use  . Smoking status: Heavy Tobacco Smoker    Packs/day: 1.00    Types: Cigarettes  . Smokeless tobacco: Never Used  Substance and Sexual  Activity  . Alcohol use: Never    Frequency: Never  . Drug use: Yes    Types: Marijuana  . Sexual activity: Not on file  Lifestyle  . Physical activity    Days per week: Not on file    Minutes per session: Not on file  . Stress: Not on file  Relationships  . Social Herbalist on phone: Not on file    Gets together: Not on file    Attends religious service: Not on file    Active member of club or organization: Not on file    Attends meetings of clubs or organizations: Not on file    Relationship  status: Not on file  . Intimate partner violence    Fear of current or ex partner: Not on file    Emotionally abused: Not on file    Physically abused: Not on file    Forced sexual activity: Not on file  Other Topics Concern  . Not on file  Social History Narrative  . Not on file    Mr. Mattern family history is not on file.      Objective:    Vitals:   07/08/19 1444  BP: 100/70  Pulse: 86  Temp: 98.3 F (36.8 C)    Physical Exam Well-developed well-nourished white male in no acute distress.  Height, Weight, 177, BMI 26.9  HEENT; nontraumatic normocephalic, EOMI, PER R LA, sclera anicteric. Oropharynx; not examined/mask/COVID Neck; supple, no JVD Cardiovascular; regular rate and rhythm with S1-S2, no murmur rub or gallop Pulmonary; Clear bilaterally Abdomen; soft, nontender, nondistended, no palpable mass or hepatosplenomegaly, bowel sounds are active Rectal; not done Skin; benign exam, no jaundice rash or appreciable lesions Extremities; no clubbing cyanosis or edema skin warm and dry Neuro/Psych; alert and oriented x4, grossly nonfocal mood and affect appropriate       Assessment & Plan:   #57 43 year old white male with acute hepatitis B,, with positive hepatitis B E antigen. Symptomatically he is improved, and labs are significantly improved. INR remains normal which is reassuring.  #2 intermittent hematochezia-etiology not clear, no prior colonoscopy.  Rule out occult lesion, possible internal hemorrhoids.  Plan; Will plan to repeat labs in 2 weeks, CBC, c-Met and INR.  We will continue serial labs until transaminases normalize, then repeat hepatitis B surface antigen and hepatitis B E antigen. Patient advised to continue to avoid Tylenol and NSAIDs, avoid EtOH which he asked about. Continue safe sex precautions. Patient remains out of work, he says he may need to return next week but is not sure he is ready.  He admits to feeling anxious.  We will schedule  for Colonoscopy with Dr. Rush Landmark.Procedure was discussed in detail with patient including indications, risks and benefits and he is agreeable to proceed.  Amy S Esterwood PA-C 07/08/2019   Cc: Janith Lima, MD

## 2019-07-08 NOTE — Patient Instructions (Signed)
You have been scheduled for a colonoscopy. Please follow written instructions given to you at your visit today.  Please pick up your prep supplies at the pharmacy within the next 1-3 days. If you use inhalers (even only as needed), please bring them with you on the day of your procedure. Your physician has requested that you go to www.[email or web address] and enter the access code given to you at your visit today. This web site gives a general overview about your procedure. However, you should still follow specific instructions given to you by our office regarding your preparation for the procedure. _____________________________________________________________ Avoid all alcohol. _____________________________________________________________ Continue omeprazole 20 mg once daily _____________________________________________________________ Please Avoid all NSAID's. Some examples of NSAID's are as follows: Aspirin (Bufferin, Bayer, and Excedrin) Ibuprofen (Advil, Motrin, Nuprin) Ketoprofen (Actron, Orudis) Naproxen (Aleve) Daypro  Indocin  Lodine  Naprosyn  Relafen  Vimovo Voltaren _____________________________________________________________ Your provider has requested that you go to the basement level for lab work on 07/21/2019. Press "B" on the elevator. The lab is located at the first door on the left as you exit the elevator. _____________________________________________________________ If you are age 44 or older, your body mass index should be between 23-30. Your Body mass index is 26.91 kg/m. If this is out of the aforementioned range listed, please consider follow up with your Primary Care Provider.  If you are age 12 or younger, your body mass index should be between 19-25. Your Body mass index is 26.91 kg/m. If this is out of the aformentioned range listed, please consider follow up with your Primary Care Provider.

## 2019-07-09 ENCOUNTER — Ambulatory Visit (INDEPENDENT_AMBULATORY_CARE_PROVIDER_SITE_OTHER): Payer: Commercial Managed Care - PPO | Admitting: Internal Medicine

## 2019-07-09 ENCOUNTER — Other Ambulatory Visit: Payer: Self-pay

## 2019-07-09 ENCOUNTER — Encounter: Payer: Self-pay | Admitting: Internal Medicine

## 2019-07-09 DIAGNOSIS — B169 Acute hepatitis B without delta-agent and without hepatic coma: Secondary | ICD-10-CM

## 2019-07-09 NOTE — Progress Notes (Signed)
[facility]  Patient Active Problem List   Diagnosis Date Noted  . Acute hepatitis B 06/17/2019    Priority: High  . Transaminitis 06/18/2019  . Hyperbilirubinemia 06/18/2019  . Cigarette smoker 06/17/2019  . Painless jaundice 06/16/2019    Patient's Medications  New Prescriptions   No medications on file  Previous Medications   MELATONIN 10 MG CAPS    Take 10 mg by mouth at bedtime as needed (sleep).   NAPROXEN SODIUM (ALEVE) 220 MG TABLET    Take 440 mg by mouth 2 (two) times daily as needed (pain).   OMEPRAZOLE (PRILOSEC) 20 MG CAPSULE    Take 20 mg by mouth daily.   VITAMIN K 100 MCG TABLET    Take 100 mcg by mouth daily.  Modified Medications   No medications on file  Discontinued Medications   No medications on file    Subjective: Zanden is in for his hospital follow-up visit.  He was hospitalized last month after developing jaundice and was found to have acute hepatitis B.  He denies any known risk factors for hepatitis B.  His significant other is a Research scientist (physical sciences) who is hepatitis B antibody positive.  He started to improve spontaneously in the hospital.  He had no findings to suggest impending liver failure or coagulopathy.  I elected not to treat him with antiviral agents.  He is feeling better now.  He had a repeat lab work 2 days ago which showed continued improvement in his liver enzymes and bilirubin.  His INR was normal.  Hepatitis A and C antibodies were negative.  His HIV antibody was also negative.  Review of Systems: Review of Systems  Constitutional: Negative for fever.  Gastrointestinal: Negative for abdominal pain, diarrhea, nausea and vomiting.    Past Medical History:  Diagnosis Date  . Acute hepatitis B   . Anxiety     Social History   Tobacco Use  . Smoking status: Heavy Tobacco Smoker    Packs/day: 1.00    Types: Cigarettes  . Smokeless tobacco: Never Used  Substance Use Topics  . Alcohol use:  Never    Frequency: Never  . Drug use: Yes    Types: Marijuana    No family history on file.  No Known Allergies  Objective: Vitals:   07/09/19 1533  BP: 108/77  Pulse: 91  Temp: 98 F (36.7 C)  Height: 5\' 8"  (1.727 m)   Body mass index is 26.91 kg/m.  Physical Exam Constitutional:      Comments: He appears well and in no distress.  Cardiovascular:     Rate and Rhythm: Normal rate and regular rhythm.     Heart sounds: No murmur.  Pulmonary:     Effort: Pulmonary effort is normal.     Breath sounds: Normal breath sounds.  Abdominal:     Palpations: Abdomen is soft. There is no mass.     Tenderness: There is no abdominal tenderness.  Psychiatric:        Mood and Affect: Mood normal.     Lab Results CMP     Component Value Date/Time   NA 137 06/23/2019 0845   K 4.5 06/23/2019 0845   CL 100 06/23/2019 0845   CO2 28 06/23/2019 0845   GLUCOSE 103 (H) 06/23/2019 0845   BUN 12 06/23/2019 0845   CREATININE 0.83 06/23/2019 0845   CALCIUM 10.1 06/23/2019 0845   PROT 6.8 07/07/2019 1031  ALBUMIN 4.1 07/07/2019 1031   AST 25 07/07/2019 1031   ALT 103 (H) 07/07/2019 1031   ALKPHOS 149 (H) 07/07/2019 1031   BILITOT 2.1 (H) 07/07/2019 1031   GFRNONAA >60 06/19/2019 0521   GFRAA >60 06/19/2019 0521     Problem List Items Addressed This Visit      High   Acute hepatitis B    He is improving spontaneously.  I will repeat his hepatitis B surface antigen and antibody levels to see if he is developing immunity.  He will follow-up here in 4 weeks.      Relevant Orders   Hepatitis B surface antibody,qualitative   Hepatitis B surface antigen       Michel Bickers, MD [facility] [phone] pager   [phone] cell 07/09/2019, 3:51 PM

## 2019-07-09 NOTE — Assessment & Plan Note (Signed)
He is improving spontaneously.  I will repeat his hepatitis B surface antigen and antibody levels to see if he is developing immunity.  He will follow-up here in 4 weeks.

## 2019-07-10 NOTE — Progress Notes (Signed)
Attending Physician's Attestation   I have reviewed the chart.   I agree with the Advanced Practitioner's note, impression, and recommendations with any updates as below.  Patient seems to be on the way towards hopefully seroconverted himself in the setting of his acute hepatitis B.  We will continue to monitor as outlined by PA Dale [place].  When transferases and bilirubin are near normal or normal we will plan to evaluate hep B surface antigen and hep B E antigen activity/levels.  Agree with moving forward with colonoscopy to evaluate rectal bleeding.  Justice Britain, MD [facility] Office # [phone]

## 2019-07-11 LAB — HEPATITIS B SURFACE ANTIGEN: Hepatitis B Surface Ag: REACTIVE — AB

## 2019-07-11 LAB — HEPATITIS B SURFACE ANTIBODY,QUALITATIVE: Hep B S Ab: NONREACTIVE

## 2019-07-18 ENCOUNTER — Telehealth: Payer: Self-pay | Admitting: *Deleted

## 2019-07-18 NOTE — Telephone Encounter (Signed)
-----   Message from Larina Bras, [place] sent at 07/08/2019  3:24 PM EDT ----- Pt needs labs around 07/21/19. See amy office note 07/08/19. Orders already in epic. Remind pt

## 2019-07-18 NOTE — Telephone Encounter (Signed)
I have left a message reminding patient that he is due for labwork at our [facility] facility on 07/21/19.

## 2019-08-06 ENCOUNTER — Ambulatory Visit: Payer: Commercial Managed Care - PPO | Admitting: Internal Medicine

## 2019-08-06 ENCOUNTER — Other Ambulatory Visit: Payer: Self-pay

## 2019-08-12 ENCOUNTER — Encounter: Payer: Self-pay | Admitting: Gastroenterology

## 2019-08-14 ENCOUNTER — Telehealth: Payer: Self-pay

## 2019-08-14 ENCOUNTER — Encounter: Payer: Commercial Managed Care - PPO | Admitting: Gastroenterology

## 2019-08-14 NOTE — Telephone Encounter (Signed)
Tried to reach pt by phone with no answer.  Message sent to the pt via My Chart

## 2019-08-14 NOTE — Telephone Encounter (Signed)
-----   Message from Irving Copas., MD sent at 08/14/2019  2:18 PM EST ----- Regarding: Follow up Antonio Chapman, This patient did not show up for their Colonoscopy. Can you find out what has happened and get him rescheduled as soon as able. Also, he has not returned for his follow up liver labs, so wanted to make sure he gets those that were ordered by PA [place] completed sooner rather than later. Let me know what you find out. Thanks. GM

## 2021-01-08 IMAGING — CT CT ABD-PELV W/ CM
2 of 5 series · 15 of 46 positions shown, 17 images · IV contrast (omnipaque)
Comparison: Abdominal ultrasound June 15, 2019

CLINICAL DATA: Painless jaundice, fatigue and weight loss

EXAM:
CT ABDOMEN AND PELVIS WITH CONTRAST
TECHNIQUE: Multidetector CT imaging of the abdomen and pelvis was performed
using the standard protocol following bolus administration of
intravenous contrast.
CONTRAST:  100mL OMNIPAQUE IOHEXOL 300 MG/ML  SOLN

[Series 2: axial st · axial · 0.75mm/px · z∈[-492,-87]mm · 12 of 93 slices shown, 14 images]
[im 6/93  soft-tissue]
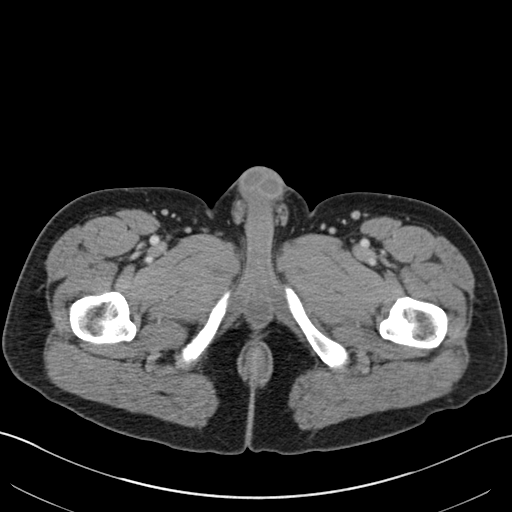
[im 6/93  bone]
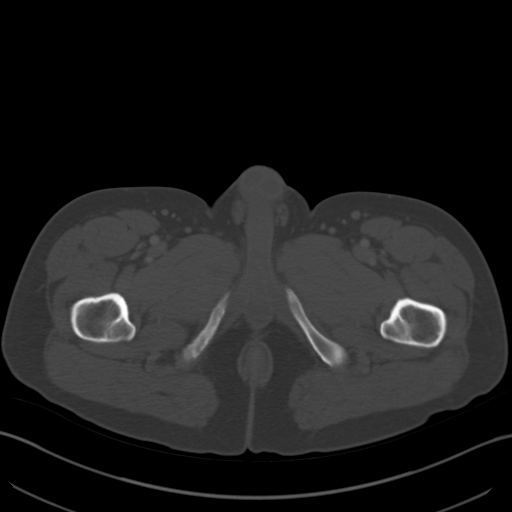
[im 12/93  soft-tissue]
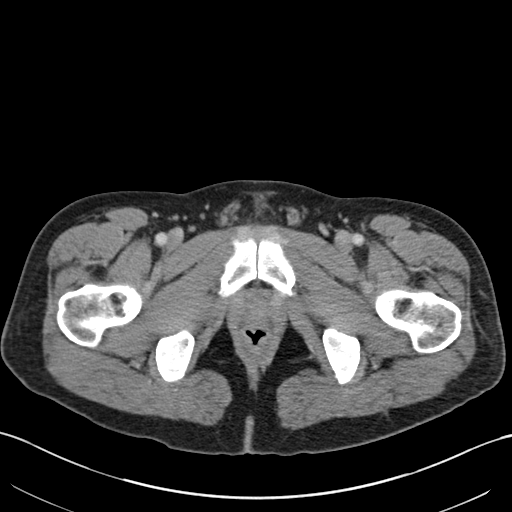
[im 24/93  soft-tissue]
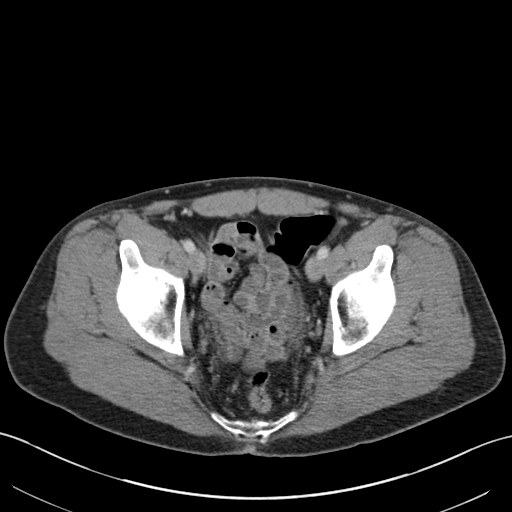
[im 29/93  soft-tissue]
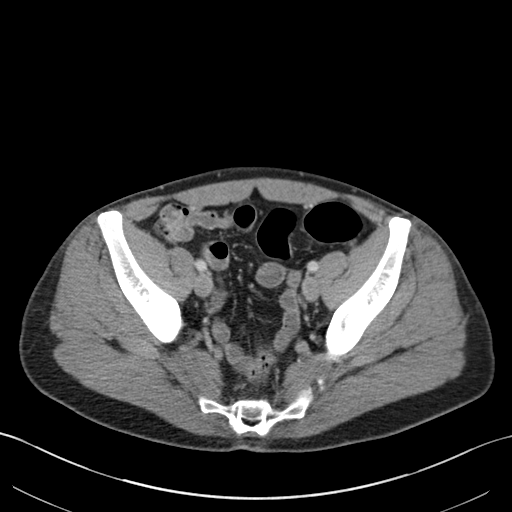
[im 35/93  soft-tissue]
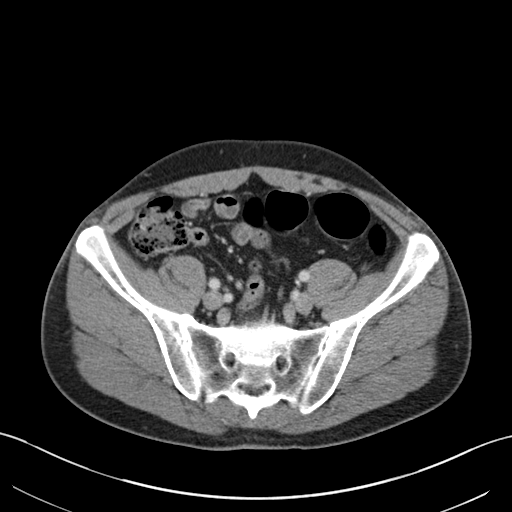
[im 41/93  soft-tissue]
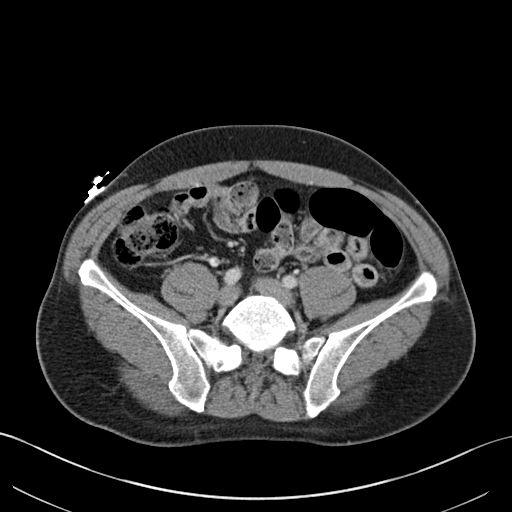
[im 52/93  soft-tissue]
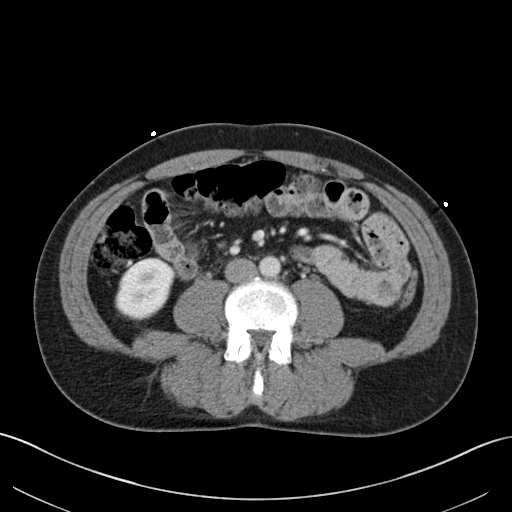
[im 58/93  soft-tissue]
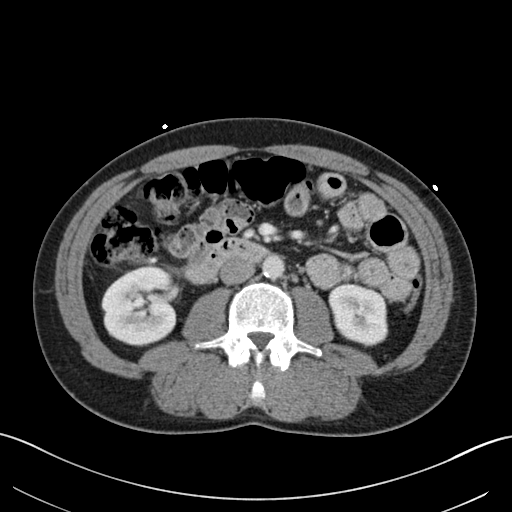
[im 64/93  soft-tissue]
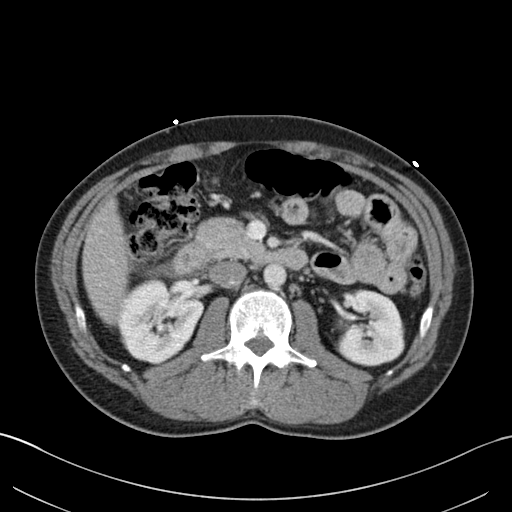
[im 64/93  bone]
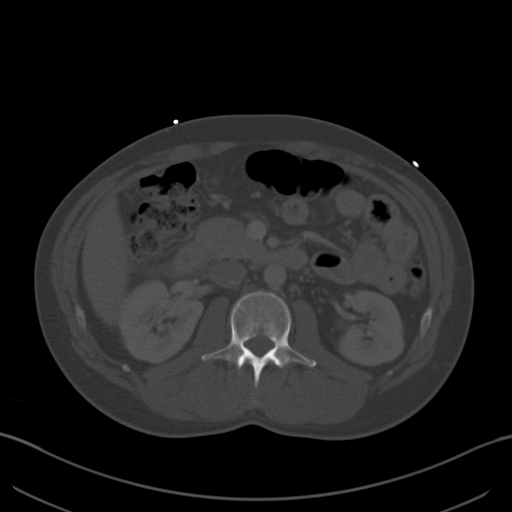
[im 70/93  soft-tissue]
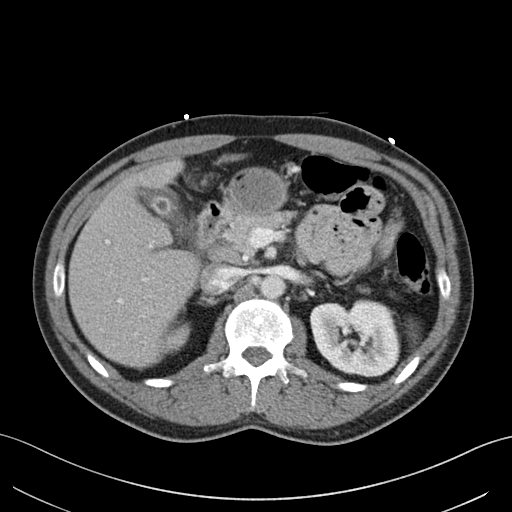
[im 81/93  soft-tissue]
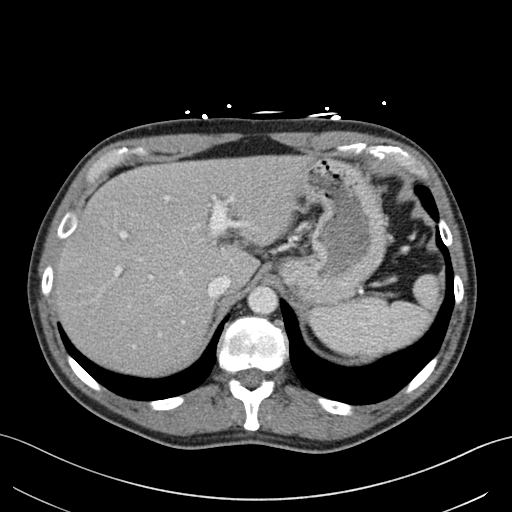
[im 87/93  soft-tissue]
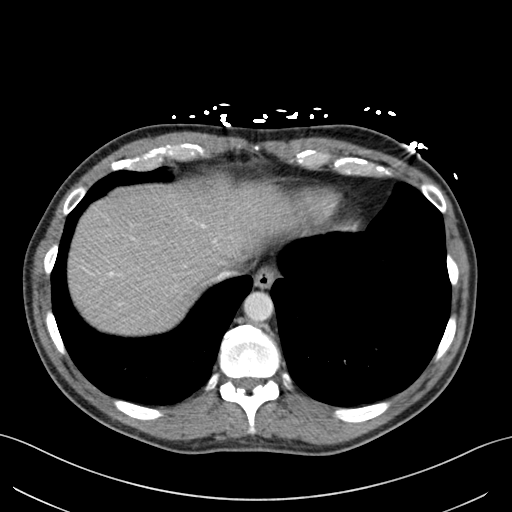

[Series 5: coronal st · coronal · 0.71mm/px · 3 of 137 slices shown]
[im 46/137  soft-tissue]
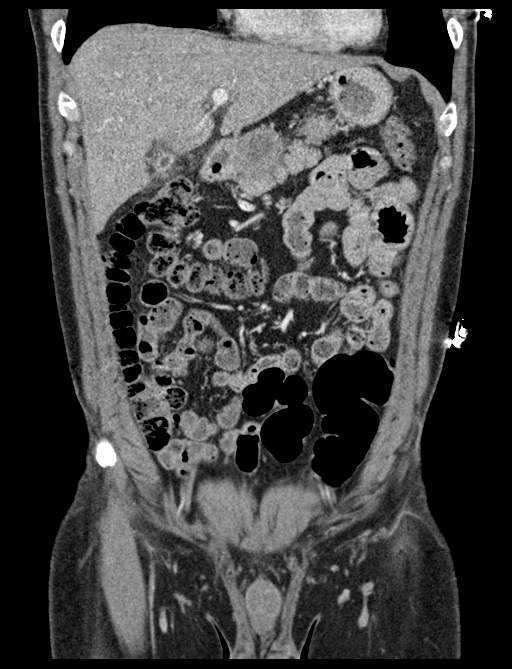
[im 61/137  soft-tissue]
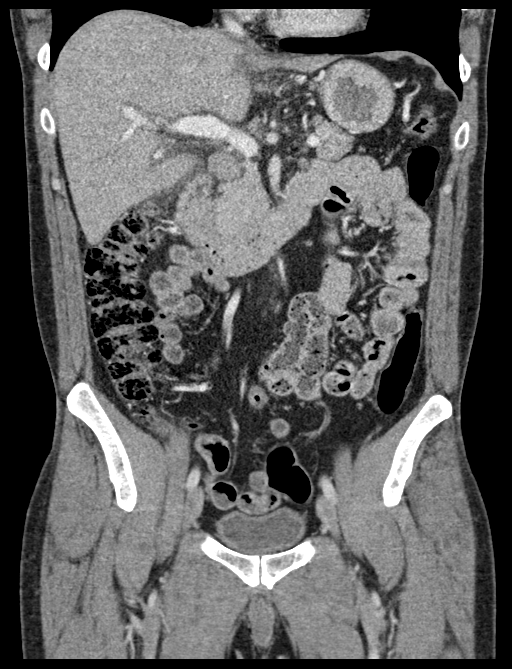
[im 76/137  soft-tissue]
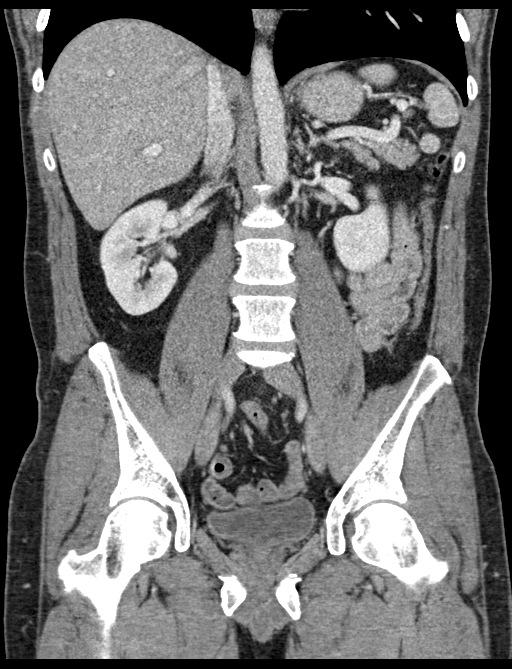

[15 of 46 positions shown; findings below may reference images not displayed]

FINDINGS: Lower chest: Lung bases are clear. Normal heart size. No pericardial
effusion.

Hepatobiliary: No suspicious hepatic lesions. The gallbladder is
largely decompressed at the time exam however gallbladder wall
thickening measures up to 6 mm in maximal thickness. There is
adjacent pericholecystic inflammation and fluid. Small amount of
fluid tracks along the inferior liver as well.

Pancreas: Unremarkable. No pancreatic ductal dilatation or
surrounding inflammatory changes.

Spleen: Normal in size without focal abnormality.

Adrenals/Urinary Tract: Adrenal glands are unremarkable. Kidneys are
normal, without renal calculi, focal lesion, or hydronephrosis. Mild
bladder wall thickening with faint perivesicular haze.

Stomach/Bowel: Distal esophagus, stomach and duodenal sweep are
unremarkable. No bowel wall thickening or dilatation. No evidence of
obstruction. A normal appendix is visualized. No colonic dilatation
or wall thickening.

Vascular/Lymphatic: Atherosclerotic plaque within the normal caliber
aorta. No aneurysm or ectasia. No suspicious or enlarged lymph nodes
in the included lymphatic chains.

Reproductive: The prostate and seminal vesicles are unremarkable.

Other: Small amount of pericholecystic and right upper quadrant
fluid. No free intraperitoneal air. No bowel containing hernias.

Musculoskeletal: No acute osseous abnormality or suspicious osseous
lesion. Mild discogenic and facet degenerative changes are present
in the included portions of the thoracic lumbar spine.
IMPRESSION: 1. The gallbladder is largely decompressed at the time exam however
gallbladder wall thickening measures up to 6 mm in maximal
thickness. There is adjacent pericholecystic and right upper
quadrant fluid. Findings are concerning for acute cholecystitis in
the appropriate clinical setting
2. Mild bladder wall thickening with faint perivesicular haze,
suggestive of cystitis. Correlate with urinary symptoms and consider
urinalysis.
3. Aortic Atherosclerosis ([id number].T).

## 2021-01-08 IMAGING — US US ABDOMEN COMPLETE
1 series · 13 of 25 positions shown · non-contrast
Comparison: None.

CLINICAL DATA: Jaundice

EXAM:
ABDOMEN ULTRASOUND COMPLETE

[Series 1: us abdomen complete · 13 of 153 slices shown]
[im 1/153]
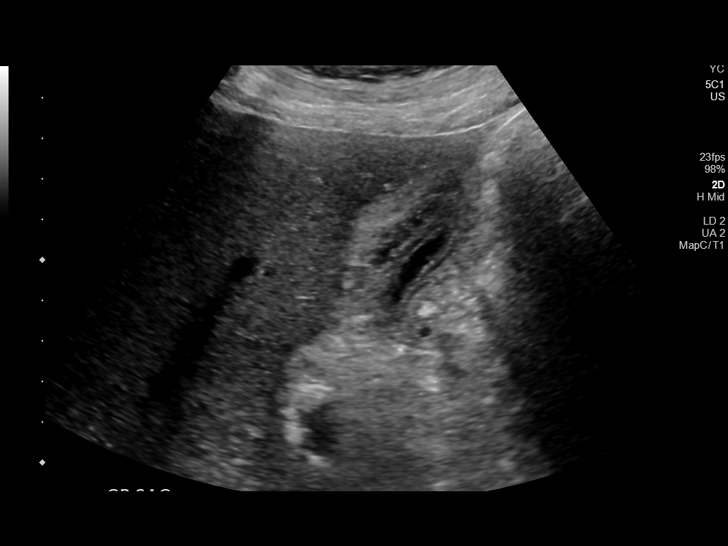
[im 13/153]
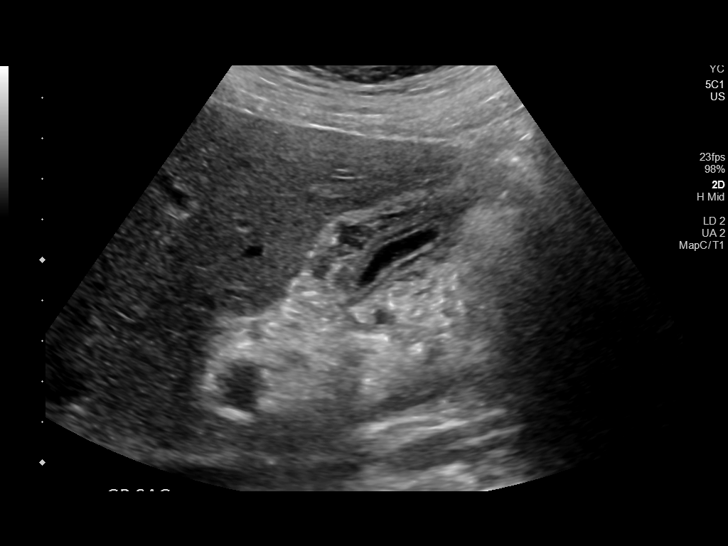
[im 26/153]
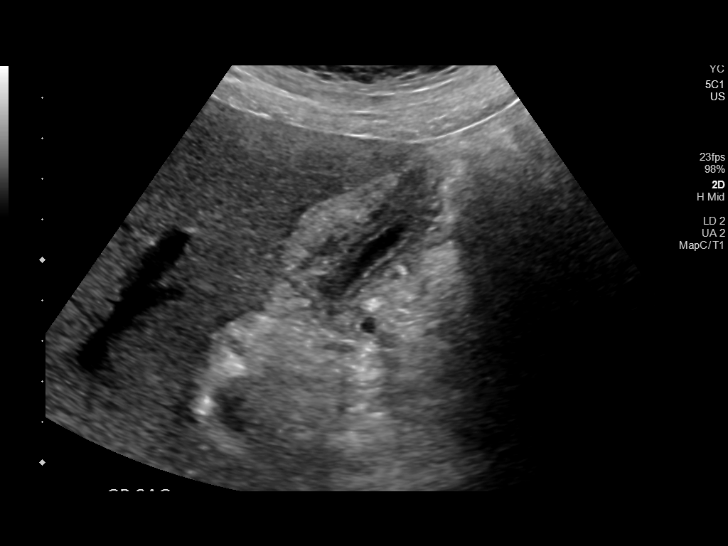
[im 39/153]
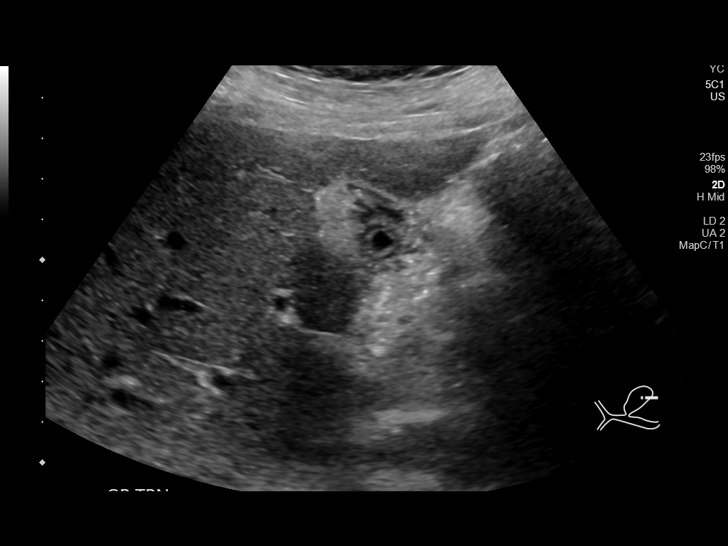
[im 51/153]
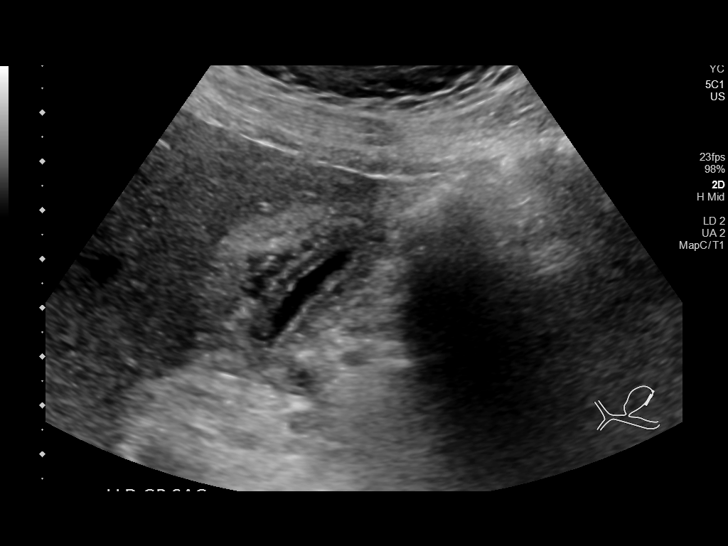
[im 64/153]
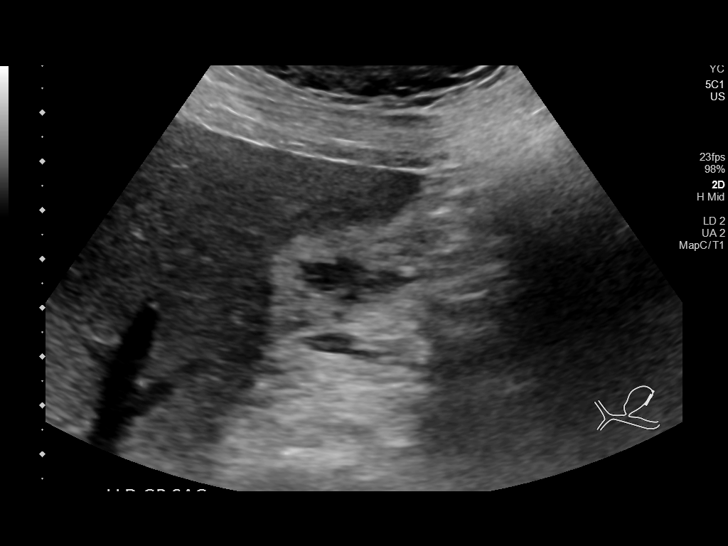
[im 77/153]
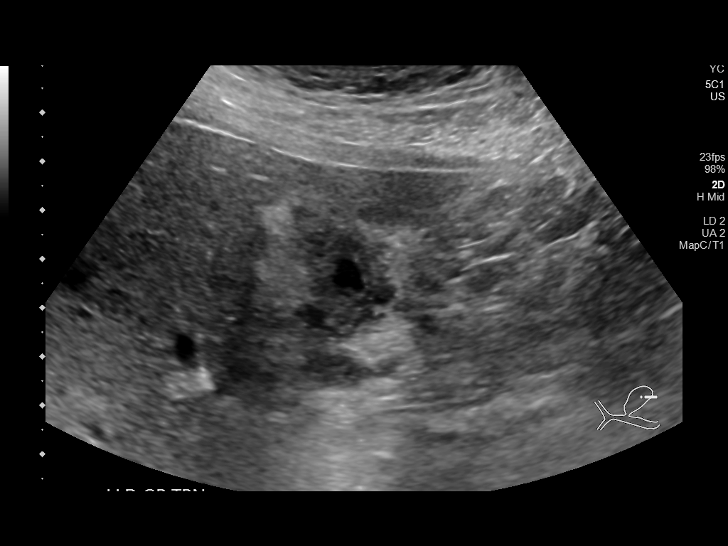
[im 89/153]
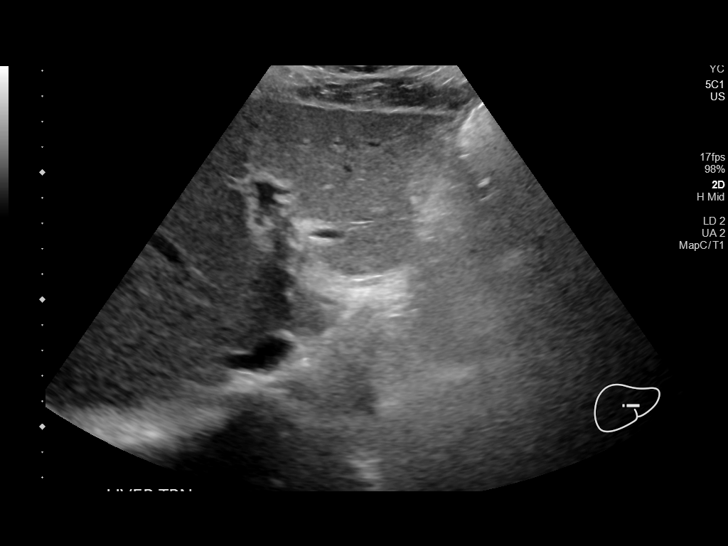
[im 102/153]
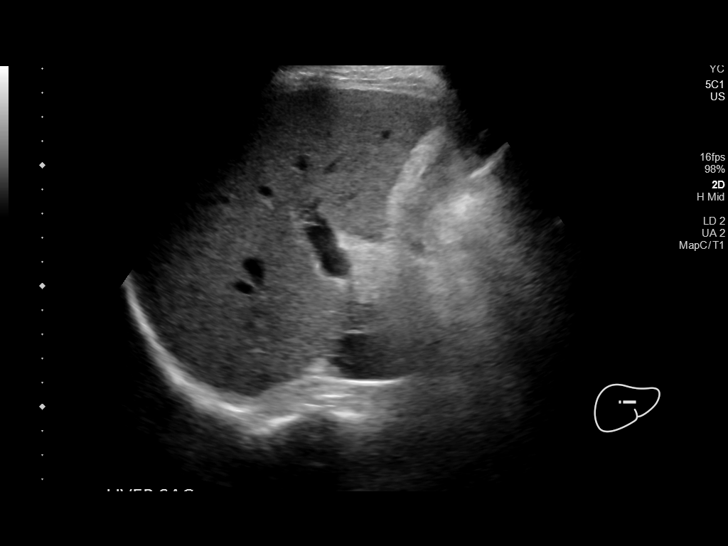
[im 115/153]
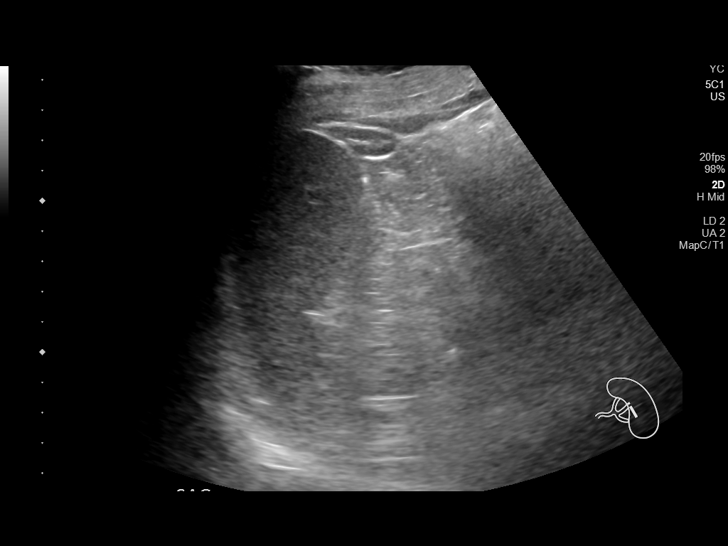
[im 127/153]
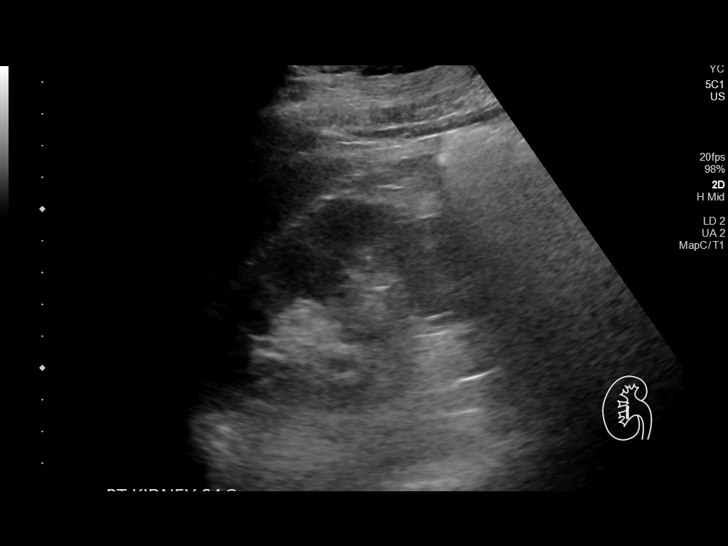
[im 140/153]
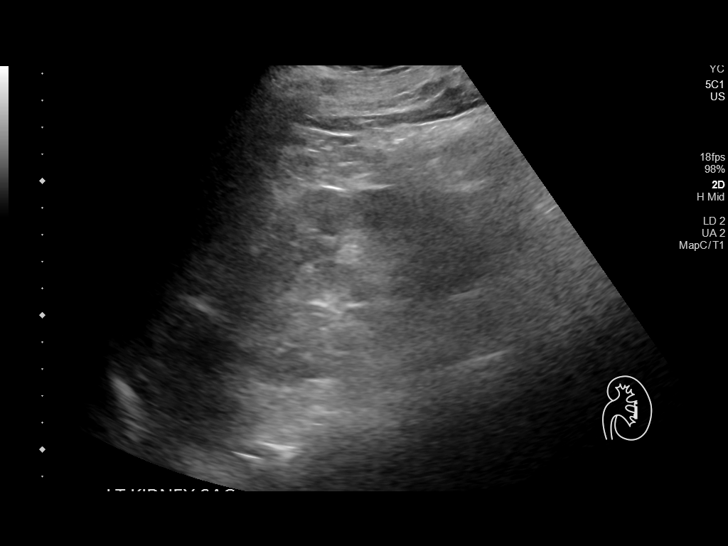
[im 153/153]
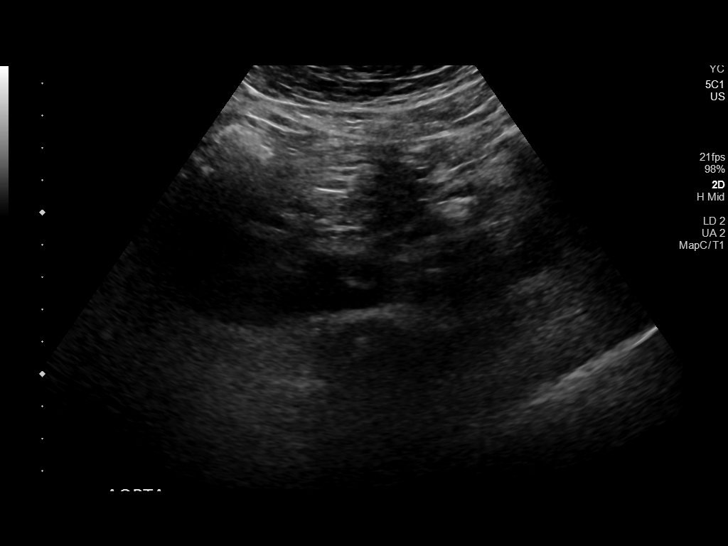

[13 of 25 positions shown; findings below may reference images not displayed]

FINDINGS: Gallbladder: The gallbladder is largely decompressed at the time of
sonographic imaging which results in suboptimal evaluation. There is
borderline gallbladder wall thickening measuring up to 5 mm in
maximal thickness. There is a trace amount of pericholecystic fluid
in the gallbladder fossa as well. Sonographic Murphy sign however is
reportedly negative. No visible calcified gallstones are evident.

Common bile duct: Diameter: 3.5 mm, normal

Liver: No focal lesion identified. Within normal limits in
parenchymal echogenicity. Portal vein is patent on color Doppler
imaging with normal direction of blood flow towards the liver.

IVC: No abnormality visualized.

Pancreas: Visualized portion unremarkable.

Spleen: Size and appearance within normal limits.

Right Kidney: Length: 11.9 cm. Echogenicity within normal limits. No
mass or hydronephrosis visualized.

Left Kidney: Length: 11.4 cm. Echogenicity within normal limits. No
mass or hydronephrosis visualized.

Abdominal aorta: No aneurysm visualized. Normal caliber at 2.4 cm in
the mid segment.

Other findings: None.
IMPRESSION: Gallbladder is largely decompressed at the time of exam with mild
wall thickening which may be related to this underdistention. A
trace amount of pericholecystic fluid is nonspecific particularly
given the negative sonographic Murphy sign. If there is persisting
clinical concern for cholecystitis, could repeat limited right upper
quadrant ultrasound in the fasting state or pursue a HIDA scan when
available.

Otherwise unremarkable abdominal ultrasound.
